# Patient Record
Sex: Male | Born: 1945 | Race: White | Hispanic: No | Smoking: Former smoker
Health system: Southern US, Community
[De-identification: ages and names within clinical notes are randomized; demographics above are authoritative.]

## PROBLEM LIST (undated history)

## (undated) DIAGNOSIS — I259 Chronic ischemic heart disease, unspecified: Secondary | ICD-10-CM

## (undated) DIAGNOSIS — I2510 Atherosclerotic heart disease of native coronary artery without angina pectoris: Secondary | ICD-10-CM

## (undated) DIAGNOSIS — I219 Acute myocardial infarction, unspecified: Secondary | ICD-10-CM

## (undated) DIAGNOSIS — I959 Hypotension, unspecified: Secondary | ICD-10-CM

## (undated) DIAGNOSIS — E785 Hyperlipidemia, unspecified: Secondary | ICD-10-CM

## (undated) DIAGNOSIS — I10 Essential (primary) hypertension: Secondary | ICD-10-CM

## (undated) DIAGNOSIS — J449 Chronic obstructive pulmonary disease, unspecified: Secondary | ICD-10-CM

## (undated) DIAGNOSIS — D72829 Elevated white blood cell count, unspecified: Secondary | ICD-10-CM

## (undated) DIAGNOSIS — R0600 Dyspnea, unspecified: Secondary | ICD-10-CM

## (undated) HISTORY — DX: Hypotension, unspecified: I95.9

## (undated) HISTORY — DX: Dyspnea, unspecified: R06.00

## (undated) HISTORY — DX: Elevated white blood cell count, unspecified: D72.829

## (undated) HISTORY — DX: Acute myocardial infarction, unspecified: I21.9

## (undated) HISTORY — DX: Atherosclerotic heart disease of native coronary artery without angina pectoris: I25.10

## (undated) HISTORY — DX: Chronic ischemic heart disease, unspecified: I25.9

## (undated) HISTORY — DX: Hyperlipidemia, unspecified: E78.5

---

## 1999-12-31 HISTORY — PX: LEFT HEART CATH: SHX5946

## 1999-12-31 HISTORY — PX: PERCUTANEOUS CORONARY STENT INTERVENTION (PCI-S): SHX6016

## 2000-10-12 ENCOUNTER — Inpatient Hospital Stay (HOSPITAL_COMMUNITY): Admission: EM | Admit: 2000-10-12 | Discharge: 2000-10-15 | Payer: Self-pay | Admitting: Emergency Medicine

## 2000-10-12 ENCOUNTER — Encounter: Payer: Self-pay | Admitting: Emergency Medicine

## 2000-10-13 ENCOUNTER — Encounter: Payer: Self-pay | Admitting: Internal Medicine

## 2005-10-21 ENCOUNTER — Ambulatory Visit: Payer: Self-pay | Admitting: Internal Medicine

## 2005-11-29 ENCOUNTER — Ambulatory Visit: Payer: Self-pay | Admitting: Internal Medicine

## 2006-08-22 ENCOUNTER — Ambulatory Visit: Payer: Self-pay | Admitting: Internal Medicine

## 2009-01-10 ENCOUNTER — Emergency Department (HOSPITAL_COMMUNITY): Admission: EM | Admit: 2009-01-10 | Discharge: 2009-01-10 | Payer: Self-pay | Admitting: Emergency Medicine

## 2009-06-20 ENCOUNTER — Telehealth: Payer: Self-pay | Admitting: Internal Medicine

## 2009-07-15 ENCOUNTER — Emergency Department (HOSPITAL_COMMUNITY): Admission: EM | Admit: 2009-07-15 | Discharge: 2009-07-15 | Payer: Self-pay | Admitting: Emergency Medicine

## 2009-07-22 DIAGNOSIS — I10 Essential (primary) hypertension: Secondary | ICD-10-CM

## 2009-07-22 DIAGNOSIS — I219 Acute myocardial infarction, unspecified: Secondary | ICD-10-CM | POA: Insufficient documentation

## 2009-07-22 DIAGNOSIS — D72829 Elevated white blood cell count, unspecified: Secondary | ICD-10-CM | POA: Insufficient documentation

## 2009-07-22 DIAGNOSIS — I2510 Atherosclerotic heart disease of native coronary artery without angina pectoris: Secondary | ICD-10-CM | POA: Insufficient documentation

## 2009-07-22 DIAGNOSIS — F17200 Nicotine dependence, unspecified, uncomplicated: Secondary | ICD-10-CM

## 2009-07-22 DIAGNOSIS — E785 Hyperlipidemia, unspecified: Secondary | ICD-10-CM

## 2009-07-22 DIAGNOSIS — I959 Hypotension, unspecified: Secondary | ICD-10-CM

## 2009-07-22 DIAGNOSIS — I259 Chronic ischemic heart disease, unspecified: Secondary | ICD-10-CM

## 2009-08-30 ENCOUNTER — Ambulatory Visit: Payer: Self-pay | Admitting: Internal Medicine

## 2009-08-30 DIAGNOSIS — R0602 Shortness of breath: Secondary | ICD-10-CM

## 2009-08-30 DIAGNOSIS — J4489 Other specified chronic obstructive pulmonary disease: Secondary | ICD-10-CM | POA: Insufficient documentation

## 2009-08-30 DIAGNOSIS — J449 Chronic obstructive pulmonary disease, unspecified: Secondary | ICD-10-CM

## 2009-09-08 ENCOUNTER — Ambulatory Visit: Payer: Self-pay | Admitting: Internal Medicine

## 2009-09-08 ENCOUNTER — Ambulatory Visit: Payer: Self-pay

## 2009-09-08 ENCOUNTER — Encounter: Payer: Self-pay | Admitting: Internal Medicine

## 2009-09-18 ENCOUNTER — Telehealth: Payer: Self-pay | Admitting: Internal Medicine

## 2009-09-18 LAB — CONVERTED CEMR LAB
ALT: 25 units/L (ref 0–53)
AST: 24 units/L (ref 0–37)
Albumin: 3.9 g/dL (ref 3.5–5.2)
Alkaline Phosphatase: 57 units/L (ref 39–117)
Bilirubin, Direct: 0.1 mg/dL (ref 0.0–0.3)
Cholesterol: 121 mg/dL (ref 0–200)
HDL: 39.7 mg/dL (ref >39.00)
LDL Cholesterol: 71 mg/dL (ref 0–99)
Total Bilirubin: 0.9 mg/dL (ref 0.3–1.2)
Total CHOL/HDL Ratio: 3
Total Protein: 7.1 g/dL (ref 6.0–8.3)
Triglycerides: 54 mg/dL (ref 0.0–149.0)
VLDL: 10.8 mg/dL (ref 0.0–40.0)

## 2011-04-07 LAB — URINALYSIS, ROUTINE W REFLEX MICROSCOPIC
Bilirubin Urine: NEGATIVE
Ketones, ur: NEGATIVE mg/dL
Nitrite: NEGATIVE
Specific Gravity, Urine: 1.007 (ref 1.005–1.030)
Urobilinogen, UA: 0.2 mg/dL (ref 0.0–1.0)
pH: 7 (ref 5.0–8.0)

## 2011-04-11 ENCOUNTER — Other Ambulatory Visit: Payer: Self-pay | Admitting: *Deleted

## 2011-04-11 MED ORDER — BUDESONIDE-FORMOTEROL FUMARATE 160-4.5 MCG/ACT IN AERO: 2.0000 | INHALATION_SPRAY | Freq: Two times a day (BID) | RESPIRATORY_TRACT | Status: DC

## 2011-05-17 NOTE — Assessment & Plan Note (Signed)
[facility]                           ELECTROPHYSIOLOGY OFFICE NOTE   SOFIA, JAQUITH                      MRN:          [id number]  DATE:08/22/2006                            DOB:          [birth year]    Randy Kirk returns today for followup.  He is a very pleasant 65 year old  man with a history of ischemic heart disease status post angioplasty with a  history of tobacco use and hypertension and dyslipidemia.  He returns today  for followup.  He notes that he was unable to keep taking Niaspan that we  had prescribed last October secondary to flushing.  He does admit to be  walking on a regular basis up to 2 miles a day.  He denies chest pain with  exertion and overall has been well.  He denies shortness of breath.  He  denies peripheral edema.   PHYSICAL EXAMINATION:  He is a pleasant middle-aged man in no acute  distress.  The blood pressure today was 110/70, the pulse was 65 and  regular, respirations were 18 and the weight was 164 pounds, down 13 pounds  from his visit back in October.  NECK:  Revealed no jugular venous distension.  LUNGS:  Clear bilaterally to auscultation.  CARDIOVASCULAR:  Exam revealed a regular rate and rhythm with normal S1 and  S2.  EXTREMITIES:  Demonstrated no clubbing, cyanosis or edema.   The EKG today demonstrates sinus rhythm with normal axis and intervals.   IMPRESSION:  1. Ischemic heart disease with preserved left ventricular function.  2. Hypertension.  3. Ongoing tobacco abuse.  4. Dyslipidemia with low HDL.   DISCUSSION:  Overall, Mr. Bethard is stable today.  Will plan to re-prescribe  his nitroglycerin as his bottle of pills has dissolved though he has not  required nitro and I will plan on checking fasting lipids and a liver panel  today.  No medicine changes were made.  He will continue on Toprol, aspirin  and Crestor.  Will see him back in a year.                                   Doylene Canning.  Ladona Ridgel, MD   GWT/MedQ  DD:  08/22/2006  DT:  08/22/2006  Job #:  [id number]   cc:   [facility]

## 2011-05-17 NOTE — Cardiovascular Report (Signed)
[place]. [facility]  Patient:    Randy Kirk, Randy Kirk                      MRN: [id number] Proc. Date: 10/13/00 Adm. Date:  [id number] Attending:  Lewayne Bunting CC:         Vale Haven. Andrey Campanile, M.D., Phone no. [phone]  Doylene Canning. Ladona Ridgel, M.D. [facility]   Cardiac Catheterization  DATE OF BIRTH:  1946/05/30.  REFERRING PHYSICIAN:  Duffy Rhody C. Andrey Campanile, M.D., [facility]  CARDIOLOGIST:  Doylene Canning. Ladona Ridgel, M.D. [facility]  PROCEDURES PERFORMED: 1. Left heart catheterization with selective coronary angiography. 2. Ventriculography. 3. Injection of the right femoral artery, rule out pseudoaneurysm.  DIAGNOSES: 1. High-grade single-vessel coronary artery disease of the mid left anterior    descending artery. 2. Normal left ventricular systolic function. 3. Diastolic dysfunction, elevated left ventricular end diastolic pressure.  HISTORY:  Mr. Gartman is a 65 year old white male with multiple risk factors for coronary artery disease.  The patient had been admitted for new onset substernal chest pain.  Serial enzymes were positive for non-Q-wave myocardial infarction.  The patient has now been referred for a diagnostic catheterization to assess his coronary anatomy.  DESCRIPTION OF PROCEDURE:  After informed consent was obtained, the patient was brought to the catheterization laboratory.  The right groin was sterilely prepped and draped.  Lidocaine, 1%, was used to infiltrate the right groin, and a 6-French arterial sheath was placed using the modified Seldinger technique.  Due to bleeding around the catheter, this sheath was upgraded to a 7-French arterial sheath.  Subsequently, the 6-French JL4 and JR4 catheters were used to engage the left and right coronary arteries.  Selective angiography was performed in various projections using manual injection of contrast.  Subsequently, ventriculography was performed using a 6-French angled pigtail catheter.   Appropriate left-sided hemodynamics were obtained. Ventriculography was then performed in a single-plane RAO projection using power injection of contrast.  After pigtail catheter was removed, angiography was performed through the arterial sheath to rule out pseudoaneurysm.  At the termination of the case, the catheters were removed.  The sheath was left in place and sewn in as per instructions left by Dr. Chales Abrahams.  The patient was given 3000 units of intravenous heparin.  The patient tolerated the procedure well and was then returned to the holding area, awaiting percutaneous coronary intervention of the mid LAD.  FINDINGS:  Hemodynamics:  Left ventricular pressure 116/27 mmHg.  Aortic pressure 116/74 mmHg.  Ventriculography:  Significant ectopy during the contrast injection.  There appeared to be mild hypokinesis on the anterior wall.  Ejection fraction was 65-70% with post PVC beat.  There was no mitral regurgitation.  Selective coronary angiography: 1. Left main coronary artery:  This was a large caliber vessel.  It has focal    stenosis, 30%, in the distal vessel. 2. Left anterior descending artery:  This was a large caliber vessel with a    proximal 20-30% focal stenosis.  Just beyond the first septal perforator,    there was a 90% mid LAD stenosis with apparent thrombus.  The remainder of    the LAD was free of significant flow-limiting coronary artery disease. 3. Left circumflex coronary artery:  This was a large caliber vessel with a    mid 30% stenosis.  The first obtuse marginal branch was a very large    bifurcating branch with a diffuse 20% stenosis in the mid vessel.  The  terminal branches were free of disease. 4. The right coronary artery showed no evidence of flow-limiting coronary    artery disease.  The coronary system appeared to be codominant.  CONCLUSIONS: 1. Single-vessel coronary artery disease with high-grade LAD stenosis of the    mid segment. 2. Normal  LV systolic function. 3. Elevated left ventricular end diastolic pressure.  RECOMMENDATIONS:  Results were discussed with the patient.  Images were reviewed with Dr. Chales Abrahams.  The plan is to proceed with percutaneous coronary intervention to the LAD.  Awaiting the procedure, the arterial sheath was sewn in, and the patient was given 3000 units of intravenous heparin.  I would like a protease 2B3A inhibitor to be continued in the interim. DD:  10/13/00 TD:  10/13/00 Job: [id number]

## 2011-05-17 NOTE — Discharge Summary (Signed)
[place]. [facility]  Patient:    Randy Kirk, Randy Kirk                      MRN: [id number] Adm. Date:  [id number] Disc. Date: [id number] Attending:  Lewayne Bunting Dictator:   Lavella Hammock, P.A. CC:         Dr. Benedetto Goad at [facility]   Referring Physician Discharge [place]  DATE OF BIRTH:  03-Aug-1946  PROCEDURES: 1. Cardiac catheterization. 2. Coronary arteriogram. 3. Left ventriculogram. 4. PTCA and stent of one vessel.  HOSPITAL COURSE:  Randy Kirk is a 65 year old male who went to the emergency room on October 12, 2000 with chest pain.   He described the pain as a tightness that radiated to his back and arms, and was associated with diaphoresis and shortness of breath and nausea, but no vomiting.  He was admitted to rule out MI and for further evaluation.  His enzymes were positive and it was felt that he had had a non-Q-wave MI.  He was stabilized on aspirin, heparin, Integrilin, and scheduled for a cardiac catheterization.  He had a cardiac catheterization on October 13, 2000 which showed a left main with a distal 20-30% lesion and a LAD with a 30% lesion proximally and 90% mid lesion with thrombus.  The circumflex had a mid 30% lesion and there was a large bifurcating OM-1 with a 20% lesion.  The RCA had no significant coronary artery disease.  His EF was 65% with mild anterior hypokinesis.  Percutaneous intervention to the LAD was done with PTCA and two stents to the proximal and mid region.  The stenosis was reduced from 95% to 0 with TIMI 3 flow.  He was placed on Plavix, as well.  He stabilized, and by October 15, 2000, was feeling much better.  He was seen by cardiac rehab and ambulated without any chest pain.  He had some hypotension on Lopressor at 25 mg b.i.d. and this was decreased to Toprol XL 25 mg q.d.  He had leukocytosis, with a white count peaking at 19,000.  However, it was trending down and his urinalysis and  chest x-ray were okay, so it was felt that this was secondary to MI.  He was considered stable for discharge on October 15, 2000.  LABORATORY DATA:  Chest x-ray:  Pulmonary hyperinflation and prominence of the bronchovascular markings are seen, consistent with COPD.  There is no evidence of acute infiltrates or effusion and no masses.  Laboratory values:  Hemoglobin 14.6, hematocrit 40.6, wbcs 16.2, platelets 275.  Sodium 135, potassium 4.4, chloride 104, CO2 23, BUN 10, creatinine 0.9, glucose 124.  CK-MB peaked at 918/78.1, trending down at discharge.  Total cholesterol 199, triglycerides 175, HDL 32, LDL 132.  Urinalysis negative.  DISCHARGE CONDITION:  Improved.  CONSULTS:  None.  COMPLICATIONS:  None.  DISCHARGE DIAGNOSES: 1. Non-Q-wave myocardial infarction with percutaneous transluminal coronary    angioplasty and stent to the left anterior descending artery this    admission. 2. Hyperlipidemia. 3. Leukocytosis with no signs of infection on urinalysis or chest x-ray,    probably secondary to myocardial infarction. 4. Ongoing history of tobacco use. 5. Hypotension secondary beta blocker, dose decreased.  DISCHARGE INSTRUCTIONS:  ACTIVITY:  He is to do no driving for a week and no strenuous or sexual activity until cleared by M.D.  He can return to work per M.D.  DIET:  He is to stick to a  low fat diet.  WOUND CARE:  He is to call the office for bleeding, swelling, or drainage at the catheterization site.  FOLLOW-UP:  He is to follow up with Dr. Ladona Ridgel, and the office will call.  He is to follow up with Dr. Benedetto Goad as needed.  DISCHARGE MEDICATIONS: 1. Coated aspirin 325 mg q.d. 2. Plavix 75 mg q.d. for a month. 3. Lipitor 10 mg q.d. 4. Wellbutrin 150 mg q.d. for three days, then 150 mg b.i.d. 5. Toprol XL 25 mg q.d. 6. Nitroglycerin 0.4 mg sublingual p.r.n. DD:  10/15/00 TD:  10/15/00 Job: [id number]

## 2011-05-17 NOTE — Op Note (Signed)
[place]. [facility]  Patient:    Randy Kirk, Randy Kirk                      MRN: [id number] Proc. Date: 10/13/00 Adm. Date:  [id number] Attending:  Lewayne Bunting CC:         Vale Haven. Andrey Campanile, M.D., [facility]  Lewayne Bunting, M.D.  Doylene Canning. Ladona Ridgel, M.D. [facility]   Operative Report  PROCEDURES PERFORMED: 1. Stenting of the proximal mid left anterior descending artery. 2. Perclose, right femoral artery.  DIAGNOSIS: 1. Severe single-vessel coronary artery disease. 2. Status post non-Q-wave myocardial infarction.  HISTORY:  Mr. Stamps is a 65 year old white male with multiple cardiac risk factors who presents with substernal chest discomfort.  The patient was admitted to [facility] and subsequently ruled in for a non-Q-wave myocardial infarction.  He had significant ventricular ectopy during his hospitalization.  The patient underwent cardiac catheterization by Dr. Andee Lineman showing thrombus and severe narrowing in the mid LAD of greater than 80% with a small residual area of thrombus in the proximal LAD at the takeoff of a trivial diagonal branch.  The remainder of his coronary vessels had noncritical disease.  He had hypokinesis of the anterior wall.  He presents now for percutaneous intervention of the LAD.  DESCRIPTION OF PROCEDURE:  Informed consent had been obtained from the patient.  He was brought to the catheterization lab, and the existing 7-French sheath in the right femoral artery was exchanged over wire for a new sheath. The patient had been on Integrilin and heparin.  This was supplemented to maintain an ACT of approximately 250 seconds.  A 7-French JL4 guide catheter was used to engage the left coronary artery, and selective guide shots were obtained using manual injections of contrast.  This confirmed the presence of two small trivial diagonal branches in the proximal LAD and a large diagonal branch in the mid section.  There was  mild irregular narrowing of 30% at the takeoff of the first diagonal branch with a further high-grade lesion of at least 80% with thrombus in the mid LAD prior to the takeoff of the large distal diagonal branch.  A 0.014-inch Patriot wire was advanced into the distal LAD, and a 3.5 x 18-mm NIR Elite stent positioned in the mid LAD under cineangiography at the area of severe stenosis and just proximal to the large diagonal branch.  This was primarily deployed at 12 atmospheres for 60 seconds.  The stent delivery system was used to further dilate the stent at 16 atmospheres for 30 seconds.  Repeat angiography showed an excellent result with no residual stenosis.  There was the residual lesion in the proximal LAD with evidence of thrombus that appeared slightly worse after stent deployment. A 3.5 x 12-mm NIR Elite stent was then carefully positioned in the proximal LAD and deployed at 18 atmospheres for 30 seconds.  The stent delivery system was then used to hit the junction of the two stents at 18 atmospheres for 30 seconds.  Repeat angiography now showed an excellent result with no residual stenosis, no evidence of residual thrombus, and no distal vessel damage. There was loss of the small first diagonal branch.  Final angiography was performed in various projections showing no distal vessel damage and no residual stenosis.  The guide catheter was then removed, and a Perclose suture closure device was deployed to the right femoral artery until adequate hemostasis was achieved.  The patient tolerated the procedure  well with chest discomfort during balloon inflations.  He was then transferred to the floor in stable condition.  FINAL RESULT:  Successful stenting of the proximal mid LAD with reduction of 80% thrombotic lesion to 0% with placement of a 3.5 x 12-mm NIR Elite stent in the proximal segment followed by a 3.5 x 18-mm NIR Elite stent in the mid segment. DD:  10/13/00 TD:   10/13/00 Job: [id number]

## 2011-08-08 ENCOUNTER — Other Ambulatory Visit: Payer: Self-pay | Admitting: *Deleted

## 2011-08-08 MED ORDER — ROSUVASTATIN CALCIUM 10 MG PO TABS: 10.0000 mg | ORAL_TABLET | Freq: Every day | ORAL | Status: DC

## 2011-08-08 MED ORDER — BUDESONIDE-FORMOTEROL FUMARATE 160-4.5 MCG/ACT IN AERO: 2.0000 | INHALATION_SPRAY | Freq: Two times a day (BID) | RESPIRATORY_TRACT | Status: DC

## 2011-10-10 ENCOUNTER — Other Ambulatory Visit: Payer: Self-pay | Admitting: Internal Medicine

## 2011-11-12 ENCOUNTER — Other Ambulatory Visit: Payer: Self-pay

## 2011-11-12 MED ORDER — METOPROLOL TARTRATE 25 MG PO TABS: 25.0000 mg | ORAL_TABLET | Freq: Every day | ORAL | Status: DC

## 2011-11-12 MED ORDER — METOPROLOL TARTRATE 25 MG PO TABS: 25.0000 mg | ORAL_TABLET | Freq: Two times a day (BID) | ORAL | Status: DC

## 2011-11-12 NOTE — Telephone Encounter (Signed)
.   Requested Prescriptions   Signed Prescriptions Disp Refills  . metoprolol tartrate (LOPRESSOR) 25 MG tablet 30 tablet 2    Sig: Take 1 tablet (25 mg total) by mouth daily.    Authorizing Provider: Lewayne Bunting    Ordering User: Lacie Scotts   Contact pt to set appointment to be seen by Dr. Ladona Ridgel. Pt has not been in for visit currently.E-scribe refill to [facility].  Pt will  come in for check up once appointment is made.

## 2011-12-11 ENCOUNTER — Other Ambulatory Visit: Payer: Self-pay | Admitting: Internal Medicine

## 2012-02-27 ENCOUNTER — Other Ambulatory Visit: Payer: Self-pay | Admitting: Cardiology

## 2012-03-07 ENCOUNTER — Other Ambulatory Visit: Payer: Self-pay | Admitting: Internal Medicine

## 2012-03-18 ENCOUNTER — Telehealth: Payer: Self-pay | Admitting: Internal Medicine

## 2012-03-18 NOTE — Telephone Encounter (Signed)
LOV,12,Echo faxed to [facility] @ [place] [phone] 03/18/12/KM

## 2012-03-21 ENCOUNTER — Other Ambulatory Visit: Payer: Self-pay | Admitting: Cardiology

## 2012-03-24 ENCOUNTER — Telehealth: Payer: Self-pay | Admitting: Internal Medicine

## 2012-03-24 NOTE — Telephone Encounter (Signed)
LOV x2,Echo,12 faxed to [facility] @ [place] @  [phone] 03/24/12/KM

## 2012-04-05 ENCOUNTER — Other Ambulatory Visit: Payer: Self-pay | Admitting: Internal Medicine

## 2012-05-20 ENCOUNTER — Other Ambulatory Visit: Payer: Self-pay | Admitting: Internal Medicine

## 2012-05-20 NOTE — Telephone Encounter (Signed)
Pt needs appointment then refill can be made Fax Received. Refill Completed. Randy Kirk (R.M.A)   

## 2012-05-21 ENCOUNTER — Telehealth: Payer: Self-pay | Admitting: Internal Medicine

## 2012-05-21 NOTE — Telephone Encounter (Signed)
New problem:  Patient calling stating someone called him yesterday returning call back.

## 2012-05-21 NOTE — Telephone Encounter (Signed)
Spoke with the patient.  No one left him a message yesterday.  It was not me that called.  I have asked Efraim Kaufmann is scheduling  It was not her.  I let him know they would call him back hopefully if needed

## 2012-06-26 ENCOUNTER — Other Ambulatory Visit: Payer: Self-pay | Admitting: Internal Medicine

## 2012-07-01 ENCOUNTER — Other Ambulatory Visit: Payer: Self-pay | Admitting: Internal Medicine

## 2012-07-14 ENCOUNTER — Other Ambulatory Visit: Payer: Self-pay | Admitting: Internal Medicine

## 2012-08-10 ENCOUNTER — Ambulatory Visit (INDEPENDENT_AMBULATORY_CARE_PROVIDER_SITE_OTHER): Payer: Medicare Other | Admitting: Internal Medicine

## 2012-08-10 ENCOUNTER — Encounter: Payer: Self-pay | Admitting: Internal Medicine

## 2012-08-10 VITALS — BP 121/71 | HR 67 | Ht 69.0 in | Wt 160.8 lb

## 2012-08-10 DIAGNOSIS — E785 Hyperlipidemia, unspecified: Secondary | ICD-10-CM

## 2012-08-10 DIAGNOSIS — I10 Essential (primary) hypertension: Secondary | ICD-10-CM

## 2012-08-10 DIAGNOSIS — E7800 Pure hypercholesterolemia, unspecified: Secondary | ICD-10-CM

## 2012-08-10 DIAGNOSIS — I2510 Atherosclerotic heart disease of native coronary artery without angina pectoris: Secondary | ICD-10-CM

## 2012-08-10 DIAGNOSIS — F17200 Nicotine dependence, unspecified, uncomplicated: Secondary | ICD-10-CM

## 2012-08-10 MED ORDER — NITROGLYCERIN 0.4 MG SL SUBL: 0.4000 mg | SUBLINGUAL_TABLET | SUBLINGUAL | Status: DC | PRN

## 2012-08-10 MED ORDER — ATORVASTATIN CALCIUM 20 MG PO TABS: 20.0000 mg | ORAL_TABLET | Freq: Every day | ORAL | Status: DC

## 2012-08-10 NOTE — Patient Instructions (Addendum)
Your physician wants you to follow-up in: 12 months with Dr Court Joy will receive a reminder letter in the mail two months in advance. If you don't receive a letter, please call our office to schedule the follow-up appointment.   Your physician has recommended you make the following change in your medication:  1) Stop Crestor 2) Start Atorvastatin 20mg  daily--have your lab work checked fasting around 09/21/12

## 2012-08-10 NOTE — Assessment & Plan Note (Signed)
The patient is having no anginal symptoms at this point. His blood pressure is well controlled. I've encouraged him to increase his physical activity.

## 2012-08-10 NOTE — Assessment & Plan Note (Signed)
The patient states that he has stop smoking for 3 weeks. I've encouraged him to continue smoking cessation.

## 2012-08-10 NOTE — Assessment & Plan Note (Signed)
Today he complains about the high cost of his cholesterol-lowering medication. I'll switch him to atorvastatin.

## 2012-08-10 NOTE — Progress Notes (Signed)
HPI [name] returns today for followup. He is a very pleasant 66 year old man with coronary disease, hypertension, and dyslipidemia. He states that he stopped smoking 3 weeks ago. His wife died just over a year ago from lung cancer. Allergies  Allergen Reactions  . Penicillins      Current Outpatient Prescriptions  Medication Sig Dispense Refill  . metoprolol tartrate (LOPRESSOR) 25 MG tablet Take 25 mg by mouth 2 (two) times daily.      . SYMBICORT 160-4.5 MCG/ACT inhaler INHALE 2 PUFFS INTO THE LUNGS 2 (TWO) TIMES DAILY.  1 Inhaler  3  . DISCONTD: metoprolol tartrate (LOPRESSOR) 25 MG tablet Take 1 tablet (25 mg total) by mouth daily.  30 tablet  2  . atorvastatin (LIPITOR) 20 MG tablet Take 1 tablet (20 mg total) by mouth daily.  90 tablet  3  . nitroGLYCERIN (NITROSTAT) 0.4 MG SL tablet Place 1 tablet (0.4 mg total) under the tongue every 5 (five) minutes as needed for chest pain.  90 tablet  3     No past medical history on file.  ROS:   All systems reviewed and negative except as noted in the HPI.   No past surgical history on file.   No family history on file.   History   Social History  . Marital Status: Married    Spouse Name: N/A    Number of Children: N/A  . Years of Education: N/A   Occupational History  . Not on file.   Social History Main Topics  . Smoking status: Former Smoker -- 20 years    Types: Cigarettes    Quit date: 07/27/2012  . Smokeless tobacco: Never Used  . Alcohol Use: No  . Drug Use: No  . Sexually Active: Not on file   Other Topics Concern  . Not on file   Social History Narrative  . No narrative on file     BP 121/71  Pulse 67  Ht 5\' 9"  (1.753 m)  Wt 160 lb 12.8 oz (72.938 kg)  BMI 23.75 kg/m2  Physical Exam:  Well appearing NAD HEENT: Unremarkable Neck:  No JVD, no thyromegally Lymphatics:  No adenopathy Back:  No CVA tenderness Lungs:  Clear with no wheezes, rales, or rhonchi. HEART:  Regular rate rhythm, no  murmurs, no rubs, no clicks Abd:  soft, positive bowel sounds, no organomegally, no rebound, no guarding Ext:  2 plus pulses, no edema, no cyanosis, no clubbing Skin:  No rashes no nodules Neuro:  CN II through XII intact, motor grossly intact  EKG Normal sinus rhythm with sinus arrhythmia  Assess/Plan:

## 2012-08-22 ENCOUNTER — Other Ambulatory Visit: Payer: Self-pay | Admitting: Cardiology

## 2012-08-24 ENCOUNTER — Other Ambulatory Visit: Payer: Medicare Other

## 2012-09-11 ENCOUNTER — Other Ambulatory Visit: Payer: Self-pay | Admitting: Internal Medicine

## 2012-09-24 ENCOUNTER — Other Ambulatory Visit (INDEPENDENT_AMBULATORY_CARE_PROVIDER_SITE_OTHER): Payer: Medicare Other

## 2012-09-24 DIAGNOSIS — I10 Essential (primary) hypertension: Secondary | ICD-10-CM

## 2012-09-24 DIAGNOSIS — E7800 Pure hypercholesterolemia, unspecified: Secondary | ICD-10-CM

## 2012-09-24 LAB — HEPATIC FUNCTION PANEL
ALT: 15 U/L (ref 0–53)
AST: 19 U/L (ref 0–37)
Alkaline Phosphatase: 59 U/L (ref 39–117)
Total Bilirubin: 0.6 mg/dL (ref 0.3–1.2)

## 2012-09-24 LAB — LIPID PANEL
Total CHOL/HDL Ratio: 3
Triglycerides: 55 mg/dL (ref 0.0–149.0)

## 2013-01-05 ENCOUNTER — Telehealth: Payer: Self-pay | Admitting: Internal Medicine

## 2013-01-05 NOTE — Telephone Encounter (Signed)
New problem:   Patient calling regarding test results from Sept.

## 2013-01-05 NOTE — Telephone Encounter (Signed)
Lab results given from 09/25/11, labs normal/ no changes.

## 2013-02-08 ENCOUNTER — Other Ambulatory Visit: Payer: Self-pay | Admitting: Cardiology

## 2013-04-20 ENCOUNTER — Other Ambulatory Visit: Payer: Self-pay | Admitting: Internal Medicine

## 2013-05-22 ENCOUNTER — Other Ambulatory Visit: Payer: Self-pay | Admitting: Cardiology

## 2013-09-16 ENCOUNTER — Other Ambulatory Visit: Payer: Self-pay | Admitting: Internal Medicine

## 2013-09-17 ENCOUNTER — Other Ambulatory Visit: Payer: Self-pay

## 2013-09-17 MED ORDER — METOPROLOL TARTRATE 25 MG PO TABS: ORAL_TABLET | ORAL | Status: DC

## 2014-02-03 ENCOUNTER — Other Ambulatory Visit: Payer: Self-pay | Admitting: Internal Medicine

## 2014-02-09 ENCOUNTER — Other Ambulatory Visit: Payer: Self-pay

## 2014-02-09 MED ORDER — ATORVASTATIN CALCIUM 20 MG PO TABS: ORAL_TABLET | ORAL | Status: DC

## 2014-02-18 ENCOUNTER — Other Ambulatory Visit: Payer: Self-pay | Admitting: Cardiology

## 2014-03-13 ENCOUNTER — Emergency Department (HOSPITAL_COMMUNITY): Payer: Medicare Other

## 2014-03-13 ENCOUNTER — Encounter (HOSPITAL_COMMUNITY): Payer: Self-pay | Admitting: Emergency Medicine

## 2014-03-13 ENCOUNTER — Emergency Department (HOSPITAL_COMMUNITY)
Admission: EM | Admit: 2014-03-13 | Discharge: 2014-03-13 | Disposition: A | Discharge status: Home / Self Care | Payer: Medicare Other | Attending: Emergency Medicine | Admitting: Emergency Medicine

## 2014-03-13 DIAGNOSIS — R071 Chest pain on breathing: Secondary | ICD-10-CM | POA: Insufficient documentation

## 2014-03-13 DIAGNOSIS — Z880 Allergy status to penicillin: Secondary | ICD-10-CM | POA: Insufficient documentation

## 2014-03-13 DIAGNOSIS — J4489 Other specified chronic obstructive pulmonary disease: Secondary | ICD-10-CM | POA: Insufficient documentation

## 2014-03-13 DIAGNOSIS — Z79899 Other long term (current) drug therapy: Secondary | ICD-10-CM | POA: Insufficient documentation

## 2014-03-13 DIAGNOSIS — I10 Essential (primary) hypertension: Secondary | ICD-10-CM | POA: Insufficient documentation

## 2014-03-13 DIAGNOSIS — Z87891 Personal history of nicotine dependence: Secondary | ICD-10-CM | POA: Insufficient documentation

## 2014-03-13 DIAGNOSIS — E785 Hyperlipidemia, unspecified: Secondary | ICD-10-CM | POA: Insufficient documentation

## 2014-03-13 DIAGNOSIS — Z7982 Long term (current) use of aspirin: Secondary | ICD-10-CM | POA: Insufficient documentation

## 2014-03-13 DIAGNOSIS — J449 Chronic obstructive pulmonary disease, unspecified: Secondary | ICD-10-CM | POA: Insufficient documentation

## 2014-03-13 DIAGNOSIS — R0789 Other chest pain: Secondary | ICD-10-CM

## 2014-03-13 DIAGNOSIS — IMO0002 Reserved for concepts with insufficient information to code with codable children: Secondary | ICD-10-CM | POA: Insufficient documentation

## 2014-03-13 HISTORY — DX: Essential (primary) hypertension: I10

## 2014-03-13 HISTORY — DX: Chronic obstructive pulmonary disease, unspecified: J44.9

## 2014-03-13 HISTORY — DX: Hyperlipidemia, unspecified: E78.5

## 2014-03-13 MED ORDER — MORPHINE SULFATE 4 MG/ML IJ SOLN
4.0000 mg | Freq: Once | INTRAMUSCULAR | Status: AC
  Administered 2014-03-13: 4 mg via INTRAMUSCULAR
  Filled 2014-03-13: qty 1

## 2014-03-13 MED ORDER — NAPROXEN 375 MG PO TABS: 375.0000 mg | ORAL_TABLET | Freq: Two times a day (BID) | ORAL | Status: DC

## 2014-03-13 MED ORDER — ONDANSETRON HCL 4 MG PO TABS
4.0000 mg | ORAL_TABLET | Freq: Once | ORAL | Status: AC
  Administered 2014-03-13: 4 mg via ORAL
  Filled 2014-03-13: qty 1

## 2014-03-13 MED ORDER — OXYCODONE-ACETAMINOPHEN 5-325 MG PO TABS: 1.0000 | ORAL_TABLET | Freq: Four times a day (QID) | ORAL | Status: DC | PRN

## 2014-03-13 NOTE — ED Provider Notes (Signed)
CSN: [id number]     Arrival date & time 03/13/14  1140 History   First MD Initiated Contact with Patient 03/13/14 1230     Chief Complaint  Patient presents with  . Flank Pain     (Consider location/radiation/quality/duration/timing/severity/associated sxs/prior Treatment) HPI Patient reports he started having left lateral posterior chest pain yesterday. He denies any known injury. He states it hurts with deep breathing or coughing. He states if he holds pressure on it it feels better. He has a chronic cough and denies fever. He states he's had pleurisy in the past he cannot recall if this is the thing he states he woke up with the pain without known injury or trauma other than coughing. He states 2 days ago he had pain in his left shoulder that he thought was bursitis. Yesterday had pain in his left flank however it went away when he put heat on it.   [facility] Dr [facility] Dr Ladona Ridgelaylor  Past Medical History  Diagnosis Date  . COPD (chronic obstructive pulmonary disease)   . Hypertension   . Hyperlipidemia   MI years ago   No past surgical history on file. No family history on file. History  Substance Use Topics  . Smoking status: Former Smoker -- 20 years    Types: Cigarettes    Quit date: 07/27/2012  . Smokeless tobacco: Never Used  . Alcohol Use: No  lives at home Lives along  Review of Systems  All other systems reviewed and are negative.      Allergies  Penicillins  Home Medications   Current Outpatient Rx  Name  Route  Sig  Dispense  Refill  . aspirin 325 MG EC tablet   Oral   Take 325 mg by mouth daily.         Marland Kitchen. atorvastatin (LIPITOR) 20 MG tablet   Oral   Take 20 mg by mouth daily.         . budesonide-formoterol (SYMBICORT) 160-4.5 MCG/ACT inhaler   Inhalation   Inhale 2 puffs into the lungs 2 (two) times daily.         Marland Kitchen. HYDROcodone-acetaminophen (NORCO) 10-325 MG per tablet   Oral   Take 0.5-1 tablets by mouth every 6 (six) hours as  needed for severe pain.         . metoprolol tartrate (LOPRESSOR) 25 MG tablet   Oral   Take 25 mg by mouth 2 (two) times daily.         . nitroGLYCERIN (NITROSTAT) 0.4 MG SL tablet   Sublingual   Place 1 tablet (0.4 mg total) under the tongue every 5 (five) minutes as needed for chest pain.   90 tablet   3   . tiZANidine (ZANAFLEX) 2 MG tablet   Oral   Take 2 mg by mouth every 6 (six) hours as needed for muscle spasms.          . naproxen (NAPROSYN) 375 MG tablet   Oral   Take 1 tablet (375 mg total) by mouth 2 (two) times daily.   20 tablet   0    BP 124/69  Pulse 77  Temp(Src) 97.8 F (36.6 C) (Oral)  Resp 16  SpO2 94%  Vital signs normal   Physical Exam  Nursing note and vitals reviewed. Constitutional: He is oriented to person, place, and time. He appears well-developed and well-nourished.  Non-toxic appearance. He does not appear ill. No distress.  HENT:  Head: Normocephalic and atraumatic.  Right  Ear: External ear normal.  Left Ear: External ear normal.  Nose: Nose normal. No mucosal edema or rhinorrhea.  Mouth/Throat: Oropharynx is clear and moist and mucous membranes are normal. No dental abscesses or uvula swelling.  Eyes: Conjunctivae and EOM are normal. Pupils are equal, round, and reactive to light.  Neck: Normal range of motion and full passive range of motion without pain. Neck supple.  Cardiovascular: Normal rate, regular rhythm and normal heart sounds.  Exam reveals no gallop and no friction rub.   No murmur heard. Pulmonary/Chest: Effort normal and breath sounds normal. No respiratory distress. He has no wheezes. He has no rhonchi. He has no rales. He exhibits no tenderness and no crepitus.  Abdominal: Soft. Normal appearance and bowel sounds are normal. He exhibits no distension. There is no tenderness. There is no rebound and no guarding.  Musculoskeletal: Normal range of motion. He exhibits no edema and no tenderness.       Back:  Moves all  extremities well.   Area of pain noted  Neurological: He is alert and oriented to person, place, and time. He has normal strength. No cranial nerve deficit.  Skin: Skin is warm, dry and intact. No rash noted. No erythema. No pallor.  Psychiatric: He has a normal mood and affect. His speech is normal and behavior is normal. His mood appears not anxious.    ED Course  Procedures (including critical care time)  Medications  morphine 4 MG/ML injection 4 mg (4 mg Intramuscular Given 03/13/14 1303)  ondansetron (ZOFRAN) tablet 4 mg (4 mg Oral Given 03/13/14 1303)    Pt give results of his xrays. We discussed she could have a crack in the rib but does not show up on x-rays. His pain is well localized to one small area which appears to be about the ninth rib laterally. His family member thinks he has shingles and wants him to be started on shingles medication. However we discussed we would not start on shingles medications unless he develops a rash. Patient is agreeable to that plan.  Labs Review Labs Reviewed - No data to display Imaging Review Dg Ribs Unilateral W/chest Left  03/13/2014   CLINICAL DATA:  Cough, left lower rib pain  EXAM: LEFT RIBS AND CHEST - 3+ VIEW  COMPARISON:  Prior chest x-ray 07/15/2009  FINDINGS: Cardiac and mediastinal contours are within normal limits. Trace atherosclerotic calcification noted in the transverse aorta. Marked pulmonary hyperexpansion. Central bronchitic changes and diffuse interstitial prominence. Findings are similar compared to prior and likely reflect chronic COPD. No suspicious pulmonary nodule or mass. No evidence of acute fracture or bony lesion by conventional radiography.  IMPRESSION: 1. No acute rib fracture or bony abnormality. 2. Stable chest x-ray without evidence of acute cardiopulmonary process.   Electronically Signed   By: Malachy Moan M.D.   On: 03/13/2014 13:07     EKG Interpretation None      MDM   Final diagnoses:  Left-sided  chest wall pain    Discharge Medication List as of 03/13/2014  2:43 PM    START taking these medications   Details  naproxen (NAPROSYN) 375 MG tablet Take 1 tablet (375 mg total) by mouth 2 (two) times daily., Starting 03/13/2014, Until Discontinued, Print    oxyCODONE-acetaminophen (PERCOCET/ROXICET) 5-325 MG per tablet Take 1 tablet by mouth every 6 (six) hours as needed for severe pain., Starting 03/13/2014, Until Discontinued, Print        Plan discharge  Devoria Albe, MD,  Franz Dell, MD 03/13/14 [phone]

## 2014-03-13 NOTE — ED Notes (Signed)
He c/o pain at lower left lat. Thoracic/ribs area.  He states this began Friday (2 days ago) as pain in his left shoulder.  His skin is normal, warm and dry and he is breathing normally.  He tells me he has COPD.  He denies fever n/v/d, nor any other sign of current illness.

## 2014-03-13 NOTE — ED Notes (Signed)
Pt given a urinal.

## 2014-03-13 NOTE — ED Notes (Addendum)
Pt reports left rib cage pain, sts started yesterday after falling asleep on heating pad which he used for bursitis pain. Pt sts "I had pleurisy before I think this is it again". Pt reports pain is worse when taking deep breath or coughing.

## 2014-03-13 NOTE — Discharge Instructions (Signed)
You could have a subtle crack in your rib from coughing. Take the percocet and naprosyn for pain. Watch for a rash in your back or chest/abdomen. If you get a rash that could be shingles, call your doctor to get on medications. Recheck if you get a fever, struggle to breathe or seem worse.    Chest Wall Pain Chest wall pain is pain in or around the bones and muscles of your chest. It may take up to 6 weeks to get better. It may take longer if you must stay physically active in your work and activities.  CAUSES  Chest wall pain may happen on its own. However, it may be caused by:  A viral illness like the flu.  Injury.  Coughing.  Exercise.  Arthritis.  Fibromyalgia.  Shingles. HOME CARE INSTRUCTIONS   Avoid overtiring physical activity. Try not to strain or perform activities that cause pain. This includes any activities using your chest or your abdominal and side muscles, especially if heavy weights are used.  Put ice on the sore area.  Put ice in a plastic bag.  Place a towel between your skin and the bag.  Leave the ice on for 15-20 minutes per hour while awake for the first 2 days.  Only take over-the-counter or prescription medicines for pain, discomfort, or fever as directed by your caregiver. SEEK IMMEDIATE MEDICAL CARE IF:   Your pain increases, or you are very uncomfortable.  You have a fever.  Your chest pain becomes worse.  You have new, unexplained symptoms.  You have nausea or vomiting.  You feel sweaty or lightheaded.  You have a cough with phlegm (sputum), or you cough up blood. MAKE SURE YOU:   Understand these instructions.  Will watch your condition.  Will get help right away if you are not doing well or get worse. Document Released: 12/16/2005 Document Revised: 03/09/2012 Document Reviewed: 08/12/2011 [facility] Patient Information 2014 [place], [place].

## 2014-03-29 ENCOUNTER — Ambulatory Visit (INDEPENDENT_AMBULATORY_CARE_PROVIDER_SITE_OTHER): Payer: Medicare Other | Admitting: Internal Medicine

## 2014-03-29 ENCOUNTER — Encounter: Payer: Self-pay | Admitting: Internal Medicine

## 2014-03-29 VITALS — BP 143/78 | HR 61 | Ht 69.0 in | Wt 152.0 lb

## 2014-03-29 DIAGNOSIS — E7800 Pure hypercholesterolemia, unspecified: Secondary | ICD-10-CM

## 2014-03-29 DIAGNOSIS — I2510 Atherosclerotic heart disease of native coronary artery without angina pectoris: Secondary | ICD-10-CM

## 2014-03-29 DIAGNOSIS — I259 Chronic ischemic heart disease, unspecified: Secondary | ICD-10-CM

## 2014-03-29 LAB — HEPATIC FUNCTION PANEL
ALT: 21 U/L (ref 0–53)
AST: 17 U/L (ref 0–37)
Albumin: 4 g/dL (ref 3.5–5.2)
Alkaline Phosphatase: 45 U/L (ref 39–117)
BILIRUBIN TOTAL: 1 mg/dL (ref 0.3–1.2)
Bilirubin, Direct: 0.2 mg/dL (ref 0.0–0.3)
Total Protein: 6.8 g/dL (ref 6.0–8.3)

## 2014-03-29 LAB — LIPID PANEL
CHOL/HDL RATIO: 2
Cholesterol: 113 mg/dL (ref 0–200)
HDL: 46.5 mg/dL (ref >39.00)
LDL Cholesterol: 57 mg/dL (ref 0–99)
Triglycerides: 49 mg/dL (ref 0.0–149.0)
VLDL: 9.8 mg/dL (ref 0.0–40.0)

## 2014-03-29 MED ORDER — NAPROXEN 375 MG PO TABS: 375.0000 mg | ORAL_TABLET | Freq: Two times a day (BID) | ORAL | Status: DC

## 2014-03-29 MED ORDER — ALBUTEROL SULFATE HFA 108 (90 BASE) MCG/ACT IN AERS: 2.0000 | INHALATION_SPRAY | Freq: Four times a day (QID) | RESPIRATORY_TRACT | Status: DC | PRN

## 2014-03-29 NOTE — Assessment & Plan Note (Signed)
He will obtain fasting lipids. He remains on Atorvastatin.

## 2014-03-29 NOTE — Patient Instructions (Addendum)
Your physician wants you to follow-up in: 12 months with Dr Court Joyaylor You will receive a reminder letter in the mail two months in advance. If you don't receive a letter, please call our office to schedule the follow-up appointment.    Get Robitussin DM--over the counter

## 2014-03-29 NOTE — Progress Notes (Signed)
HPI Mr. Belvin returns today for followup. He is a very pleasant 68 year old man with coronary disease, hypertension, and dyslipidemia. He states that he stopped smoking but has had some reversions back. He will go for a week without smoking then smoke 3 or 4 cigarettes. He has been bothered by a non-productive cough and developed pleuritic chest pain. No syncope. No fever or hemoptysis.  Allergies  Allergen Reactions  . Penicillins      Current Outpatient Prescriptions  Medication Sig Dispense Refill  . aspirin 325 MG EC tablet Take 325 mg by mouth daily.      Marland Kitchen. atorvastatin (LIPITOR) 20 MG tablet Take 20 mg by mouth daily.      . budesonide-formoterol (SYMBICORT) 160-4.5 MCG/ACT inhaler Inhale 2 puffs into the lungs 2 (two) times daily.      . metoprolol tartrate (LOPRESSOR) 25 MG tablet Take 25 mg by mouth 2 (two) times daily.      . naproxen (NAPROSYN) 375 MG tablet Take 1 tablet (375 mg total) by mouth 2 (two) times daily.  20 tablet  0  . nitroGLYCERIN (NITROSTAT) 0.4 MG SL tablet Place 1 tablet (0.4 mg total) under the tongue every 5 (five) minutes as needed for chest pain.  90 tablet  3  . oxyCODONE-acetaminophen (PERCOCET/ROXICET) 5-325 MG per tablet Take 1 tablet by mouth every 6 (six) hours as needed for severe pain.  15 tablet  0   No current facility-administered medications for this visit.     Past Medical History  Diagnosis Date  . COPD (chronic obstructive pulmonary disease)   . Hypertension   . Hyperlipidemia   . Myocardial infarction   . Leukocytosis   . CAD (coronary artery disease)   . Hypotension   . Dyslipidemia   . Ischemic heart disease   . Dyspnea     ROS:   All systems reviewed and negative except as noted in the HPI.   History reviewed. No pertinent past surgical history.   No family history on file.   History   Social History  . Marital Status: Widowed    Spouse Name: N/A    Number of Children: N/A  . Years of Education: N/A    Occupational History  . Not on file.   Social History Main Topics  . Smoking status: Former Smoker -- 20 years    Types: Cigarettes    Quit date: 07/27/2012  . Smokeless tobacco: Never Used  . Alcohol Use: No  . Drug Use: No  . Sexual Activity: Not on file   Other Topics Concern  . Not on file   Social History Narrative  . No narrative on file     BP 143/78  Pulse 61  Ht 5\' 9"  (1.753 m)  Wt 152 lb (68.947 kg)  BMI 22.44 kg/m2  Physical Exam:  Well appearing 68 yo man, NAD HEENT: Unremarkable Neck:  No JVD, no thyromegally Back:  No CVA tenderness Lungs:  Clear with no wheezes, rales, or rhonchi. HEART:  Regular rate rhythm, no murmurs, no rubs, no clicks Abd:  soft, positive bowel sounds, no organomegally, no rebound, no guarding Ext:  2 plus pulses, no edema, no cyanosis, no clubbing Skin:  No rashes no nodules Neuro:  CN II through XII intact, motor grossly intact  EKG Normal sinus rhythm with sinus arrhythmia  Assess/Plan:

## 2014-03-29 NOTE — Assessment & Plan Note (Signed)
He has pleuritic pain and rib pain from coughing and I have recommended albuterol inhaler and Naproxen for pain.

## 2014-03-29 NOTE — Assessment & Plan Note (Signed)
He denies anginal symptoms. He will continue his current meds.  

## 2014-04-26 ENCOUNTER — Other Ambulatory Visit: Payer: Self-pay

## 2014-04-26 MED ORDER — ATORVASTATIN CALCIUM 20 MG PO TABS: 20.0000 mg | ORAL_TABLET | Freq: Every day | ORAL | Status: DC

## 2014-06-13 ENCOUNTER — Other Ambulatory Visit: Payer: Self-pay

## 2014-06-13 MED ORDER — METOPROLOL TARTRATE 25 MG PO TABS: 25.0000 mg | ORAL_TABLET | Freq: Two times a day (BID) | ORAL | Status: DC

## 2014-08-31 ENCOUNTER — Other Ambulatory Visit: Payer: Self-pay | Admitting: *Deleted

## 2014-08-31 MED ORDER — ATORVASTATIN CALCIUM 20 MG PO TABS: 20.0000 mg | ORAL_TABLET | Freq: Every day | ORAL | Status: DC

## 2015-03-06 ENCOUNTER — Other Ambulatory Visit: Payer: Self-pay | Admitting: Internal Medicine

## 2015-04-12 ENCOUNTER — Encounter: Payer: Self-pay | Admitting: Internal Medicine

## 2015-04-27 ENCOUNTER — Ambulatory Visit: Payer: Self-pay | Admitting: Internal Medicine

## 2015-05-18 ENCOUNTER — Ambulatory Visit (INDEPENDENT_AMBULATORY_CARE_PROVIDER_SITE_OTHER): Payer: Medicare Other | Admitting: Internal Medicine

## 2015-05-18 ENCOUNTER — Encounter: Payer: Self-pay | Admitting: Internal Medicine

## 2015-05-18 VITALS — BP 126/72 | HR 59 | Ht 69.0 in | Wt 149.6 lb

## 2015-05-18 DIAGNOSIS — I10 Essential (primary) hypertension: Secondary | ICD-10-CM | POA: Diagnosis not present

## 2015-05-18 DIAGNOSIS — I25709 Atherosclerosis of coronary artery bypass graft(s), unspecified, with unspecified angina pectoris: Secondary | ICD-10-CM

## 2015-05-18 DIAGNOSIS — E7800 Pure hypercholesterolemia, unspecified: Secondary | ICD-10-CM

## 2015-05-18 DIAGNOSIS — I259 Chronic ischemic heart disease, unspecified: Secondary | ICD-10-CM

## 2015-05-18 MED ORDER — NITROGLYCERIN 0.4 MG SL SUBL: 0.4000 mg | SUBLINGUAL_TABLET | SUBLINGUAL | Status: AC | PRN

## 2015-05-18 MED ORDER — ASPIRIN EC 81 MG PO TBEC: 81.0000 mg | DELAYED_RELEASE_TABLET | Freq: Every day | ORAL | Status: AC

## 2015-05-18 NOTE — Patient Instructions (Signed)
Medication Instructions:  Your physician has recommended you make the following change in your medication:  1) DECREASE Asprin to 81 mg daily  Labwork: None ordered  Testing/Procedures: None ordered  Follow-Up: Your physician wants you to follow-up in: 1 year with Dr. Ladona Ridgelaylor. You will receive a reminder letter in the mail two months in advance. If you don't receive a letter, please call our office to schedule the follow-up appointment.   Thank you for choosing [facility]!!

## 2015-05-18 NOTE — Assessment & Plan Note (Signed)
He denies anginal symptoms. He will continue his current meds. I have asked the patient to reduce his dose of aspirin 81 mg daily.

## 2015-05-18 NOTE — Assessment & Plan Note (Signed)
He will continue his current bronchodilators. He continues to try and stop smoking and has done so mostly. Every week or 2, he will smoke 3 or 4 cigarettes.

## 2015-05-18 NOTE — Assessment & Plan Note (Signed)
His blood pressure is well controlled. He is encouraged to continue his daily activity and maintain a low-sodium diet. No change in medications.

## 2015-05-18 NOTE — Progress Notes (Signed)
HPI Randy Kirk returns today for followup. He is a very pleasant 69 year old man with coronary disease, hypertension, and dyslipidemia. He will go for a week without smoking then smoke 3 or 4 cigarettes. He denies chest pain or sob. He remains active. No syncope. No fever or hemoptysis. He complains of easy bruising Allergies  Allergen Reactions  . Penicillins     swelling     Current Outpatient Prescriptions  Medication Sig Dispense Refill  . albuterol (PROAIR HFA) 108 (90 BASE) MCG/ACT inhaler Inhale 2 puffs into the lungs every 6 (six) hours as needed for wheezing or shortness of breath. 1 Inhaler 0  . atorvastatin (LIPITOR) 20 MG tablet TAKE 1 TABLET BY MOUTH EVERY DAY 90 tablet 0  . budesonide-formoterol (SYMBICORT) 160-4.5 MCG/ACT inhaler Inhale 2 puffs into the lungs 2 (two) times daily.    . metoprolol tartrate (LOPRESSOR) 25 MG tablet Take 1 tablet (25 mg total) by mouth 2 (two) times daily. 180 tablet 3  . nitroGLYCERIN (NITROSTAT) 0.4 MG SL tablet Place 1 tablet (0.4 mg total) under the tongue every 5 (five) minutes as needed for chest pain. 90 tablet 3   No current facility-administered medications for this visit.     Past Medical History  Diagnosis Date  . COPD (chronic obstructive pulmonary disease)   . Hypertension   . Hyperlipidemia   . Myocardial infarction   . Leukocytosis   . CAD (coronary artery disease)   . Hypotension   . Dyslipidemia   . Ischemic heart disease   . Dyspnea     ROS:   All systems reviewed and negative except as noted in the HPI.   History reviewed. No pertinent past surgical history.   Family History  Problem Relation Age of Onset  . Family history unknown: Yes     History   Social History  . Marital Status: Widowed    Spouse Name: N/A  . Number of Children: N/A  . Years of Education: N/A   Occupational History  . Not on file.   Social History Main Topics  . Smoking status: Former Smoker -- 20 years    Types:  Cigarettes    Quit date: 07/27/2012  . Smokeless tobacco: Never Used  . Alcohol Use: No  . Drug Use: No  . Sexual Activity: Not on file   Other Topics Concern  . Not on file   Social History Narrative     BP 126/72 mmHg  Pulse 59  Ht 5\' 9"  (1.753 m)  Wt 149 lb 9.6 oz (67.858 kg)  BMI 22.08 kg/m2  Physical Exam:  Well appearing 69 yo man, NAD HEENT: Unremarkable Neck:  No JVD, no thyromegally Back:  No CVA tenderness Lungs:  Clear with no wheezes, rales, or rhonchi. HEART:  Regular rate rhythm, no murmurs, no rubs, no clicks Abd:  soft, positive bowel sounds, no organomegally, no rebound, no guarding Ext:  2 plus pulses, no edema, no cyanosis, no clubbing Skin:  No rashes no nodules Neuro:  CN II through XII intact, motor grossly intact  EKG Normal sinus rhythm with sinus arrhythmia  Assess/Plan:

## 2015-06-03 ENCOUNTER — Other Ambulatory Visit: Payer: Self-pay | Admitting: Internal Medicine

## 2015-09-06 ENCOUNTER — Other Ambulatory Visit: Payer: Self-pay | Admitting: Internal Medicine

## 2015-12-02 ENCOUNTER — Other Ambulatory Visit: Payer: Self-pay | Admitting: Internal Medicine

## 2016-06-02 ENCOUNTER — Other Ambulatory Visit: Payer: Self-pay | Admitting: Internal Medicine

## 2016-06-20 ENCOUNTER — Encounter: Payer: Self-pay | Admitting: *Deleted

## 2016-07-04 ENCOUNTER — Ambulatory Visit: Payer: Medicare Other | Admitting: Internal Medicine

## 2016-08-29 ENCOUNTER — Ambulatory Visit (INDEPENDENT_AMBULATORY_CARE_PROVIDER_SITE_OTHER): Payer: Medicare Other | Admitting: Internal Medicine

## 2016-08-29 ENCOUNTER — Encounter: Payer: Self-pay | Admitting: Internal Medicine

## 2016-08-29 VITALS — BP 118/80 | HR 64 | Ht 69.0 in | Wt 129.8 lb

## 2016-08-29 DIAGNOSIS — I25709 Atherosclerosis of coronary artery bypass graft(s), unspecified, with unspecified angina pectoris: Secondary | ICD-10-CM

## 2016-08-29 MED ORDER — ATORVASTATIN CALCIUM 20 MG PO TABS
20.0000 mg | ORAL_TABLET | Freq: Every day | ORAL | 3 refills | Status: AC
Start: 2016-08-29 — End: ?

## 2016-08-29 MED ORDER — METOPROLOL TARTRATE 25 MG PO TABS: ORAL_TABLET | ORAL | 2 refills | Status: DC

## 2016-08-29 NOTE — Progress Notes (Signed)
HPI [name] returns today for followup. He is a very pleasant 70 year old man with coronary disease, hypertension, and dyslipidemia. He has stopped smoking.  He denies chest pain or sob. He remains active. No syncope. No fever or hemoptysis. He complains of easy bruising Allergies  Allergen Reactions  . Penicillins     swelling     Current Outpatient Prescriptions  Medication Sig Dispense Refill  . albuterol (PROAIR HFA) 108 (90 BASE) MCG/ACT inhaler Inhale 2 puffs into the lungs every 6 (six) hours as needed for wheezing or shortness of breath. 1 Inhaler 0  . aspirin EC 81 MG tablet Take 1 tablet (81 mg total) by mouth daily. 90 tablet 3  . atorvastatin (LIPITOR) 20 MG tablet TAKE 1 TABLET BY MOUTH EVERY DAY 90 tablet 0  . budesonide-formoterol (SYMBICORT) 160-4.5 MCG/ACT inhaler Inhale 2 puffs into the lungs 2 (two) times daily.    . metoprolol tartrate (LOPRESSOR) 25 MG tablet TAKE 1 TABLET (25 MG TOTAL) BY MOUTH 2 (TWO) TIMES DAILY. 180 tablet 2  . nitroGLYCERIN (NITROSTAT) 0.4 MG SL tablet Place 1 tablet (0.4 mg total) under the tongue every 5 (five) minutes as needed for chest pain. 25 tablet 3   No current facility-administered medications for this visit.      Past Medical History:  Diagnosis Date  . CAD (coronary artery disease)   . COPD (chronic obstructive pulmonary disease) (HCC)   . Dyslipidemia   . Dyspnea   . Hyperlipidemia   . Hypertension   . Hypotension   . Ischemic heart disease   . Leukocytosis   . Myocardial infarction (HCC)     ROS:   All systems reviewed and negative except as noted in the HPI.   Past Surgical History:  Procedure Laterality Date  . LEFT HEART CATH  2001   with selective coronary angiography  . PERCUTANEOUS CORONARY STENT INTERVENTION (PCI-S)  2001   of the mid LAD     Family History  Problem Relation Age of Onset  . Family history unknown: Yes     Social History   Social History  . Marital status: Widowed    Spouse  name: N/A  . Number of children: N/A  . Years of education: N/A   Occupational History  . Not on file.   Social History Main Topics  . Smoking status: Former Smoker    Years: 20.00    Types: Cigarettes    Quit date: 07/27/2012  . Smokeless tobacco: Never Used  . Alcohol use No  . Drug use: No  . Sexual activity: Not on file   Other Topics Concern  . Not on file   Social History Narrative  . No narrative on file     BP 118/80   Pulse 64   Ht 5\' 9"  (1.753 m)   Wt 129 lb 12.8 oz (58.9 kg)   BMI 19.17 kg/m   Physical Exam:  Well appearing 70 yo man, NAD HEENT: Unremarkable Neck:  6 cm JVD, no thyromegally Back:  No CVA tenderness Lungs:  Clear with no wheezes, rales, or rhonchi. HEART:  Regular rate rhythm, no murmurs, no rubs, no clicks Abd:  soft, positive bowel sounds, no organomegally, no rebound, no guarding Ext:  2 plus pulses, no edema, no cyanosis, no clubbing Skin:  No rashes no nodules Neuro:  CN II through XII intact, motor grossly intact  EKG Normal sinus rhythm with AS MI  Assess/Plan:  1. CAD, s/p remote MI - he has  no anginal symptoms. He will continue his current medications. 2. HTN - his blood pressure is good. We discussed switching his beta blocker but he has decided to continue metoprolol. 3. Dyslipidemia - he will continue lipitor.  Leonia Reeves.D.

## 2016-08-29 NOTE — Patient Instructions (Signed)

## 2016-09-01 ENCOUNTER — Other Ambulatory Visit: Payer: Self-pay | Admitting: Internal Medicine

## 2016-09-03 NOTE — Telephone Encounter (Signed)
Pt called in to have Proair Albuterol Inhaler. Per Dr. Ladona Ridgelaylor and Dennis BastKelly Lanier RN, I am only able to fill one time and following refills will have to come from PCP. Pt is agreeable and understand.

## 2016-12-05 ENCOUNTER — Encounter (HOSPITAL_COMMUNITY): Payer: Self-pay | Admitting: Emergency Medicine

## 2016-12-05 ENCOUNTER — Emergency Department (HOSPITAL_COMMUNITY): Payer: Medicare Other

## 2016-12-05 ENCOUNTER — Inpatient Hospital Stay (HOSPITAL_COMMUNITY)
Admission: EM | Admit: 2016-12-05 | Discharge: 2016-12-09 | DRG: 190 | Disposition: A | Discharge status: Home / Self Care | Payer: Medicare Other | Attending: Internal Medicine | Admitting: Internal Medicine

## 2016-12-05 DIAGNOSIS — E785 Hyperlipidemia, unspecified: Secondary | ICD-10-CM | POA: Diagnosis present

## 2016-12-05 DIAGNOSIS — Z87891 Personal history of nicotine dependence: Secondary | ICD-10-CM

## 2016-12-05 DIAGNOSIS — I2510 Atherosclerotic heart disease of native coronary artery without angina pectoris: Secondary | ICD-10-CM | POA: Diagnosis present

## 2016-12-05 DIAGNOSIS — Z7951 Long term (current) use of inhaled steroids: Secondary | ICD-10-CM

## 2016-12-05 DIAGNOSIS — I252 Old myocardial infarction: Secondary | ICD-10-CM

## 2016-12-05 DIAGNOSIS — R0602 Shortness of breath: Secondary | ICD-10-CM | POA: Diagnosis not present

## 2016-12-05 DIAGNOSIS — I259 Chronic ischemic heart disease, unspecified: Secondary | ICD-10-CM | POA: Diagnosis present

## 2016-12-05 DIAGNOSIS — Z66 Do not resuscitate: Secondary | ICD-10-CM | POA: Diagnosis present

## 2016-12-05 DIAGNOSIS — J441 Chronic obstructive pulmonary disease with (acute) exacerbation: Principal | ICD-10-CM

## 2016-12-05 DIAGNOSIS — E860 Dehydration: Secondary | ICD-10-CM | POA: Diagnosis present

## 2016-12-05 DIAGNOSIS — Z7982 Long term (current) use of aspirin: Secondary | ICD-10-CM

## 2016-12-05 DIAGNOSIS — J9601 Acute respiratory failure with hypoxia: Secondary | ICD-10-CM | POA: Diagnosis present

## 2016-12-05 DIAGNOSIS — Z955 Presence of coronary angioplasty implant and graft: Secondary | ICD-10-CM

## 2016-12-05 DIAGNOSIS — Z880 Allergy status to penicillin: Secondary | ICD-10-CM

## 2016-12-05 DIAGNOSIS — I10 Essential (primary) hypertension: Secondary | ICD-10-CM | POA: Diagnosis present

## 2016-12-05 DIAGNOSIS — R634 Abnormal weight loss: Secondary | ICD-10-CM | POA: Diagnosis present

## 2016-12-05 DIAGNOSIS — Z87898 Personal history of other specified conditions: Secondary | ICD-10-CM | POA: Diagnosis present

## 2016-12-05 DIAGNOSIS — Z79899 Other long term (current) drug therapy: Secondary | ICD-10-CM

## 2016-12-05 LAB — CBC WITH DIFFERENTIAL/PLATELET
BASOS PCT: 1 %
Basophils Absolute: 0.1 10*3/uL (ref 0.0–0.1)
EOS ABS: 0.2 10*3/uL (ref 0.0–0.7)
Eosinophils Relative: 1 %
HCT: 45.3 % (ref 39.0–52.0)
Hemoglobin: 16.5 g/dL (ref 13.0–17.0)
Lymphocytes Relative: 18 %
Lymphs Abs: 2.1 10*3/uL (ref 0.7–4.0)
MCH: 33.5 pg (ref 26.0–34.0)
MCHC: 36.4 g/dL — ABNORMAL HIGH (ref 30.0–36.0)
MCV: 92.1 fL (ref 78.0–100.0)
Monocytes Absolute: 1 10*3/uL (ref 0.1–1.0)
Monocytes Relative: 8 %
NEUTROS PCT: 72 %
Neutro Abs: 8.8 10*3/uL — ABNORMAL HIGH (ref 1.7–7.7)
Platelets: 259 10*3/uL (ref 150–400)
RBC: 4.92 MIL/uL (ref 4.22–5.81)
RDW: 12.9 % (ref 11.5–15.5)
WBC: 12.2 10*3/uL — AB (ref 4.0–10.5)

## 2016-12-05 LAB — BASIC METABOLIC PANEL
ANION GAP: 10 (ref 5–15)
BUN: 16 mg/dL (ref 6–20)
CO2: 21 mmol/L — ABNORMAL LOW (ref 22–32)
Calcium: 9.2 mg/dL (ref 8.9–10.3)
Chloride: 105 mmol/L (ref 101–111)
Creatinine, Ser: 0.84 mg/dL (ref 0.61–1.24)
GFR calc Af Amer: >60 mL/min (ref >60)
GLUCOSE: 96 mg/dL (ref 65–99)
POTASSIUM: 3.7 mmol/L (ref 3.5–5.1)
SODIUM: 136 mmol/L (ref 135–145)

## 2016-12-05 LAB — CBC
HCT: 41.7 % (ref 39.0–52.0)
HEMOGLOBIN: 14.7 g/dL (ref 13.0–17.0)
MCH: 32.4 pg (ref 26.0–34.0)
MCHC: 35.3 g/dL (ref 30.0–36.0)
MCV: 91.9 fL (ref 78.0–100.0)
Platelets: 252 10*3/uL (ref 150–400)
RBC: 4.54 MIL/uL (ref 4.22–5.81)
RDW: 12.8 % (ref 11.5–15.5)
WBC: 10.5 10*3/uL (ref 4.0–10.5)

## 2016-12-05 LAB — INFLUENZA PANEL BY PCR (TYPE A & B)
INFLAPCR: NEGATIVE
Influenza B By PCR: NEGATIVE

## 2016-12-05 LAB — CREATININE, SERUM
Creatinine, Ser: 0.92 mg/dL (ref 0.61–1.24)
GFR calc non Af Amer: >60 mL/min (ref >60)

## 2016-12-05 LAB — BRAIN NATRIURETIC PEPTIDE: B NATRIURETIC PEPTIDE 5: 29.4 pg/mL (ref 0.0–100.0)

## 2016-12-05 LAB — TROPONIN I

## 2016-12-05 MED ORDER — ALBUTEROL SULFATE (2.5 MG/3ML) 0.083% IN NEBU
5.0000 mg | INHALATION_SOLUTION | Freq: Once | RESPIRATORY_TRACT | Status: AC
  Administered 2016-12-05: 5 mg via RESPIRATORY_TRACT
  Filled 2016-12-05: qty 6

## 2016-12-05 MED ORDER — ONDANSETRON HCL 4 MG PO TABS: 4.0000 mg | ORAL_TABLET | Freq: Four times a day (QID) | ORAL | Status: DC | PRN

## 2016-12-05 MED ORDER — IBUPROFEN 200 MG PO TABS: 400.0000 mg | ORAL_TABLET | Freq: Four times a day (QID) | ORAL | Status: DC | PRN

## 2016-12-05 MED ORDER — IPRATROPIUM-ALBUTEROL 0.5-2.5 (3) MG/3ML IN SOLN
3.0000 mL | Freq: Once | RESPIRATORY_TRACT | Status: AC
  Administered 2016-12-05: 3 mL via RESPIRATORY_TRACT
  Filled 2016-12-05: qty 3

## 2016-12-05 MED ORDER — GUAIFENESIN ER 600 MG PO TB12
600.0000 mg | ORAL_TABLET | Freq: Two times a day (BID) | ORAL | Status: DC
  Administered 2016-12-05 – 2016-12-09 (×8): 600 mg via ORAL
  Filled 2016-12-05 (×8): qty 1

## 2016-12-05 MED ORDER — SODIUM CHLORIDE 0.9 % IV BOLUS (SEPSIS)
1000.0000 mL | Freq: Once | INTRAVENOUS | Status: AC
  Administered 2016-12-05: 1000 mL via INTRAVENOUS

## 2016-12-05 MED ORDER — ALBUTEROL SULFATE (2.5 MG/3ML) 0.083% IN NEBU
2.5000 mg | INHALATION_SOLUTION | RESPIRATORY_TRACT | Status: DC | PRN
  Administered 2016-12-05: 2.5 mg via RESPIRATORY_TRACT
  Filled 2016-12-05: qty 3

## 2016-12-05 MED ORDER — GUAIFENESIN-DM 100-10 MG/5ML PO SYRP
5.0000 mL | ORAL_SOLUTION | ORAL | Status: DC | PRN
  Administered 2016-12-05: 5 mL via ORAL
  Filled 2016-12-05: qty 5

## 2016-12-05 MED ORDER — TRAZODONE HCL 50 MG PO TABS
25.0000 mg | ORAL_TABLET | Freq: Every evening | ORAL | Status: DC | PRN
  Administered 2016-12-05: 25 mg via ORAL
  Filled 2016-12-05 (×2): qty 1

## 2016-12-05 MED ORDER — HYDRALAZINE HCL 20 MG/ML IJ SOLN: 10.0000 mg | Freq: Three times a day (TID) | INTRAMUSCULAR | Status: DC | PRN

## 2016-12-05 MED ORDER — NICOTINE 7 MG/24HR TD PT24
7.0000 mg | MEDICATED_PATCH | Freq: Every day | TRANSDERMAL | Status: DC
  Administered 2016-12-05 – 2016-12-09 (×5): 7 mg via TRANSDERMAL
  Filled 2016-12-05 (×5): qty 1

## 2016-12-05 MED ORDER — ENOXAPARIN SODIUM 40 MG/0.4ML ~~LOC~~ SOLN
40.0000 mg | SUBCUTANEOUS | Status: DC
  Administered 2016-12-05 – 2016-12-08 (×4): 40 mg via SUBCUTANEOUS
  Filled 2016-12-05 (×4): qty 0.4

## 2016-12-05 MED ORDER — ONDANSETRON HCL 4 MG/2ML IJ SOLN: 4.0000 mg | Freq: Four times a day (QID) | INTRAMUSCULAR | Status: DC | PRN

## 2016-12-05 MED ORDER — ALBUTEROL (5 MG/ML) CONTINUOUS INHALATION SOLN
10.0000 mg/h | INHALATION_SOLUTION | Freq: Once | RESPIRATORY_TRACT | Status: AC
  Administered 2016-12-05: 10 mg/h via RESPIRATORY_TRACT
  Filled 2016-12-05: qty 20

## 2016-12-05 MED ORDER — ASPIRIN EC 81 MG PO TBEC
81.0000 mg | DELAYED_RELEASE_TABLET | Freq: Every day | ORAL | Status: DC
  Administered 2016-12-05 – 2016-12-09 (×5): 81 mg via ORAL
  Filled 2016-12-05 (×5): qty 1

## 2016-12-05 MED ORDER — PREDNISONE 20 MG PO TABS
40.0000 mg | ORAL_TABLET | Freq: Every day | ORAL | Status: DC
  Administered 2016-12-05 – 2016-12-07 (×3): 40 mg via ORAL
  Filled 2016-12-05 (×3): qty 2

## 2016-12-05 MED ORDER — POLYETHYLENE GLYCOL 3350 17 G PO PACK: 17.0000 g | PACK | Freq: Every day | ORAL | Status: DC | PRN

## 2016-12-05 MED ORDER — IPRATROPIUM-ALBUTEROL 0.5-2.5 (3) MG/3ML IN SOLN
3.0000 mL | Freq: Four times a day (QID) | RESPIRATORY_TRACT | Status: DC
  Administered 2016-12-05 – 2016-12-06 (×7): 3 mL via RESPIRATORY_TRACT
  Filled 2016-12-05 (×8): qty 3

## 2016-12-05 MED ORDER — ACETAMINOPHEN 325 MG PO TABS: 650.0000 mg | ORAL_TABLET | Freq: Four times a day (QID) | ORAL | Status: DC | PRN

## 2016-12-05 MED ORDER — BUDESONIDE 0.5 MG/2ML IN SUSP
0.5000 mg | Freq: Two times a day (BID) | RESPIRATORY_TRACT | Status: DC
  Administered 2016-12-05 – 2016-12-09 (×9): 0.5 mg via RESPIRATORY_TRACT
  Filled 2016-12-05 (×9): qty 2

## 2016-12-05 MED ORDER — DOXYCYCLINE HYCLATE 100 MG PO TABS
100.0000 mg | ORAL_TABLET | Freq: Two times a day (BID) | ORAL | Status: DC
  Administered 2016-12-05 – 2016-12-09 (×9): 100 mg via ORAL
  Filled 2016-12-05 (×9): qty 1

## 2016-12-05 MED ORDER — METOPROLOL TARTRATE 25 MG PO TABS
25.0000 mg | ORAL_TABLET | Freq: Two times a day (BID) | ORAL | Status: DC
  Administered 2016-12-05 – 2016-12-09 (×9): 25 mg via ORAL
  Filled 2016-12-05 (×9): qty 1

## 2016-12-05 MED ORDER — ACETAMINOPHEN 650 MG RE SUPP: 650.0000 mg | Freq: Four times a day (QID) | RECTAL | Status: DC | PRN

## 2016-12-05 MED ORDER — ATORVASTATIN CALCIUM 20 MG PO TABS
20.0000 mg | ORAL_TABLET | Freq: Every day | ORAL | Status: DC
  Administered 2016-12-05 – 2016-12-09 (×5): 20 mg via ORAL
  Filled 2016-12-05: qty 1
  Filled 2016-12-05: qty 2
  Filled 2016-12-05 (×3): qty 1

## 2016-12-05 MED ORDER — NITROGLYCERIN 0.4 MG SL SUBL: 0.4000 mg | SUBLINGUAL_TABLET | SUBLINGUAL | Status: DC | PRN

## 2016-12-05 NOTE — ED Provider Notes (Addendum)
[facility]-EMERGENCY DEPT Provider Note   CSN: [id number] Arrival date & time: 12/05/16  0320  By signing my name below, I, [name], attest that this documentation has been prepared under the direction and in the presence of Randy Boozeavid Janeil Schexnayder, MD. Electronically Signed: Angelene GiovanniEmmanuella Mensah, ED Scribe. 12/05/16. 3:40 AM.   History   Chief Complaint Chief Complaint  Patient presents with  . Shortness of Breath   HPI Comments: Randy Kirk is a 70 y.o. male with a hx of CAD, COPD, hypertension, and past MI brought in by ambulance, who presents to the Emergency Department complaining of gradually worsening persistent moderate shortness of breath onset one week ago. He reports associated chills and non-productive cough, stating that he is unable to produce any sputum no matter how hard he coughs. No alleviating factors noted. Pt has not tried any medications PTA. He presents with bilateral leg swelling but attributes that to standing up on his feet more than normal under the fan these past few days to get a "good breath in". No alleviating factors noted. Pt did not try any medications prior to EMS arrival. He states that he receive 2 breathing treatments and prednisone en route by EMS with minimal relief. Pt has an allergy to Penicillins. He reports that he smokes approximately 8-10 cigarettes daily. He denies any fever, chest pain, chest tightness, nausea, vomiting, abdominal pain, or any other symptoms.   The history is provided by the patient. No language interpreter was used.  Shortness of Breath  This is a new problem. The average episode lasts 1 week. The problem occurs intermittently.The current episode started more than 2 days ago. The problem has been gradually worsening. Associated symptoms include cough, wheezing and leg swelling. Pertinent negatives include no fever, no sputum production, no chest pain, no vomiting, no abdominal pain and no rash. Risk factors include smoking. He has tried  nothing for the symptoms. The treatment provided no relief. Associated medical issues include COPD, CAD and past MI.    Past Medical History:  Diagnosis Date  . CAD (coronary artery disease)   . COPD (chronic obstructive pulmonary disease) (HCC)   . Dyslipidemia   . Dyspnea   . Hyperlipidemia   . Hypertension   . Hypotension   . Ischemic heart disease   . Leukocytosis   . Myocardial infarction     Patient Active Problem List   Diagnosis Date Noted  . Hyperlipidemia   . COPD 08/30/2009  . DYSPNEA 08/30/2009  . DYSLIPIDEMIA 07/22/2009  . LEUKOCYTOSIS 07/22/2009  . TOBACCO ABUSE 07/22/2009  . Essential hypertension 07/22/2009  . MYOCARDIAL INFARCTION 07/22/2009  . CAD 07/22/2009  . Chronic ischemic heart disease 07/22/2009  . HYPOTENSION 07/22/2009    Past Surgical History:  Procedure Laterality Date  . LEFT HEART CATH  2001   with selective coronary angiography  . PERCUTANEOUS CORONARY STENT INTERVENTION (PCI-S)  2001   of the mid LAD       Home Medications    Prior to Admission medications   Medication Sig Start Date End Date Taking? Authorizing Provider  aspirin EC 81 MG tablet Take 1 tablet (81 mg total) by mouth daily. 05/18/15   Marinus MawGregg W Taylor, MD  atorvastatin (LIPITOR) 20 MG tablet Take 1 tablet (20 mg total) by mouth daily. 08/29/16   Marinus MawGregg W Taylor, MD  atorvastatin (LIPITOR) 20 MG tablet TAKE 1 TABLET BY MOUTH EVERY DAY 09/03/16   Marinus MawGregg W Taylor, MD  budesonide-formoterol [facility](SYMBICORT) 160-4.5 MCG/ACT inhaler Inhale 2 puffs  into the lungs 2 (two) times daily.    Historical Provider, MD  metoprolol tartrate (LOPRESSOR) 25 MG tablet TAKE 1 TABLET (25 MG TOTAL) BY MOUTH 2 (TWO) TIMES DAILY. 08/29/16   [name], MD  metoprolol tartrate (LOPRESSOR) 25 MG tablet TAKE 1 TABLET (25 MG TOTAL) BY MOUTH 2 (TWO) TIMES DAILY. 09/03/16   [name], MD  nitroGLYCERIN (NITROSTAT) 0.4 MG SL tablet Place 1 tablet (0.4 mg total) under the tongue every 5 (five) minutes as  needed for chest pain. 05/18/15 01/03/17  [name], MD  PROAIR HFA [phone] Base) MCG/ACT inhaler INHALE 1 TO 2 PUFFS EVERY 4 TO 6 HOURS AS NEEDED. 09/03/16   [name], MD    Family History Family History  Problem Relation Age of Onset  . Family history unknown: Yes    Social History Social History  Substance Use Topics  . Smoking status: Former Smoker    Years: 20.00    Types: Cigarettes    Quit date: 07/27/2012  . Smokeless tobacco: Never Used  . Alcohol use No     Allergies   Penicillins   Review of Systems Review of Systems  Constitutional: Positive for chills. Negative for fever.  Respiratory: Positive for cough, shortness of breath and wheezing. Negative for sputum production.   Cardiovascular: Positive for leg swelling. Negative for chest pain.  Gastrointestinal: Negative for abdominal pain, nausea and vomiting.  Skin: Negative for rash.     Physical Exam Updated Vital Signs BP 135/90   Pulse 112   Temp 97.9 F (36.6 C) (Oral)   Resp 21   SpO2 95%   Physical Exam  Constitutional: He is oriented to person, place, and time. He appears well-developed and well-nourished.  Appears dyspneic   HENT:  Head: Normocephalic and atraumatic.  Eyes: EOM are normal. Pupils are equal, round, and reactive to light.  Neck: Normal range of motion. Neck supple. JVD present.  JVD present at 90 degrees   Cardiovascular: Normal rate, regular rhythm and normal heart sounds.   No murmur heard. Pulmonary/Chest: He has wheezes. He has no rales. He exhibits no tenderness.  Decreased airflow diffusely; mild expiratory wheezing  Abdominal: Soft. Bowel sounds are normal. He exhibits no distension and no mass. There is no tenderness.  Musculoskeletal: Normal range of motion. He exhibits no edema.  Lymphadenopathy:    He has no cervical adenopathy.  Neurological: He is alert and oriented to person, place, and time. No cranial nerve deficit. He exhibits normal muscle tone.  Coordination normal.  1+ pretibial edema   Skin: Skin is warm and dry. No rash noted.  Psychiatric: He has a normal mood and affect. His behavior is normal. Judgment and thought content normal.  Nursing note and vitals reviewed.    ED Treatments / Results  DIAGNOSTIC STUDIES: Oxygen Saturation is 98% on RA, normal by my interpretation.    COORDINATION OF CARE: 3:35 AM- Pt advised of plan for treatment and pt agrees. Pt will receive lab work, EKG, and chest x-ray for further evaluation. He will also receive another albuterol nebulizer treatment and duo neb.    Labs (all labs ordered are listed, but only abnormal results are displayed) Labs Reviewed  BASIC METABOLIC PANEL - Abnormal; Notable for the following:       Result Value   CO2 21 (*)    All other components within normal limits  CBC WITH DIFFERENTIAL/PLATELET - Abnormal; Notable for the following:    WBC  12.2 (*)    MCHC 36.4 (*)    Neutro Abs 8.8 (*)    All other components within normal limits  TROPONIN I  BRAIN NATRIURETIC PEPTIDE    EKG  EKG Interpretation  Date/Time:  Thursday December 05 2016 03:23:34 EST Ventricular Rate:  112 PR Interval:  168 QRS Duration: 76 QT Interval:  334 QTC Calculation: 455 R Axis:   70 Text Interpretation:  Sinus tachycardia with Premature supraventricular complexes Septal infarct , age undetermined Abnormal ECG When compared with ECG of 10/13/2000, T wave inversion is no longer Present HEART RATE has increased Confirmed by Preston FleetingGLICK  MD, Rai Severns ([id number]) on 12/05/2016 3:29:14 AM       Radiology Dg Chest Port 1 View  Result Date: 12/05/2016 CLINICAL DATA:  Dyspnea tonight EXAM: PORTABLE CHEST 1 VIEW COMPARISON:  03/13/2014 FINDINGS: Marked unchanged hyperinflation. The lungs are clear. The pulmonary vasculature is normal. There is no pleural effusion. Hilar, mediastinal and cardiac contours are unremarkable and unchanged. IMPRESSION: Marked hyperinflation.  No consolidation or effusion.  Electronically Signed   By: Ellery Plunkaniel R Mitchell M.D.   On: 12/05/2016 03:47    Procedures Procedures (including critical care time) CRITICAL CARE Performed by: XBJYN,WGNFAGLICK,Una Yeomans Total critical care time: 40 minutes Critical care time was exclusive of separately billable procedures and treating other patients. Critical care was necessary to treat or prevent imminent or life-threatening deterioration. Critical care was time spent personally by me on the following activities: development of treatment plan with patient and/or surrogate as well as nursing, discussions with consultants, evaluation of patient's response to treatment, examination of patient, obtaining history from patient or surrogate, ordering and performing treatments and interventions, ordering and review of laboratory studies, ordering and review of radiographic studies, pulse oximetry and re-evaluation of patient's condition.  Medications Ordered in ED Medications - No data to display   Initial Impression / Assessment and Plan / ED Course  Randy Boozeavid Lylah Lantis, MD has reviewed the triage vital signs and the nursing notes.  Pertinent labs & imaging results that were available during my care of the patient were reviewed by me and considered in my medical decision making (see chart for details).  Clinical Course    COPD exacerbation. Old records are reviewed, and it is noted that he has outpatient visits related to coronary artery disease but no relevant ED visits. Chest x-ray shows no evidence of pneumonia. He is given a series of 3 albuterol with ipratropium nebulizer treatments with modest improvement. Wheezing was decreased but he still had wheezy cough present. He was still mildly dyspneic, although improved. ase is discussed with Dr.  Toniann FailKakrakandy of [facility] agrees to admit the patient under observation status.  Final Clinical Impressions(s) / ED Diagnoses   Final diagnoses:  COPD exacerbation (HCC)    New Prescriptions New  Prescriptions   No medications on file   I personally performed the services described in this documentation, which was scribed in my presence. The recorded information has been reviewed and is accurate.       Randy Boozeavid Robel Wuertz, MD 12/05/16 [id number]    Randy Boozeavid Zeb Rawl, MD 12/05/16 [phone]

## 2016-12-05 NOTE — H&P (Signed)
[facility] History and Physical  Randy DistanceRonald L Trolinger WUJ:[id number]:[id number] DOB: 04/05/1946 DOA: 12/05/2016  Referring physician: PCP: Delorse LekBURNETT,BRENT A, MD   Chief Complaint: "I got so I couldn't walk across the room without getting short of breath."  HPI: Randy Kirk is a 70 y.o. male with past medical history significant for coronary artery disease, COPD, dyslipidemia, high blood pressure, ischemic heart disease and myocardial infarction presents emergency room with shortness of breath. Patient states that he's had shortness of breath for the last week. It has progressively got worse. This is been accompanied by a scantly productive cough. Patient has been using his inhalers. Patient had not sought medical treatment for this. Patient has no sick contacts. Patient does have a recent flu vaccine. Patient's pneumonia vaccinations today. Patient had 5 days of chills. Patient denies any fever. Patient states he was going to see his doctor this morning but his shortness of breath got so bad in the early morning that he activated EMS. EMS gave patient a dose of IV steroids and 2 breathing treatments with minimal improvement. In the emergency room patient received 3 treatments of albuterol, one hour long treatment with minimal improvement in his breathing status.  ED course: 3 albuterol treatments. 2 Atrovent treatments. Hyperinflated chest x-ray. Negative troponin 1. Hospitalist called for admission.  Review of Systems:  As per HPI otherwise 10 point review of systems negative.    Past Medical History:  Diagnosis Date  . CAD (coronary artery disease)   . COPD (chronic obstructive pulmonary disease) (HCC)   . Dyslipidemia   . Dyspnea   . Hyperlipidemia   . Hypertension   . Hypotension   . Ischemic heart disease   . Leukocytosis   . Myocardial infarction    Past Surgical History:  Procedure Laterality Date  . LEFT HEART CATH  2001   with selective coronary angiography  . PERCUTANEOUS  CORONARY STENT INTERVENTION (PCI-S)  2001   of the mid LAD   Social History:  reports that he quit smoking about 4 years ago. His smoking use included Cigarettes. He quit after 20.00 years of use. He has never used smokeless tobacco. He reports that he does not drink alcohol or use drugs.  Allergies  Allergen Reactions  . Penicillins     swelling    Family History  Problem Relation Age of Onset  . Family history unknown: Yes     Prior to Admission medications   Medication Sig Start Date End Date Taking? Authorizing Provider  aspirin EC 81 MG tablet Take 1 tablet (81 mg total) by mouth daily. 05/18/15  Yes Marinus MawGregg W Taylor, MD  atorvastatin (LIPITOR) 20 MG tablet Take 1 tablet (20 mg total) by mouth daily. 08/29/16  Yes Marinus MawGregg W Taylor, MD  budesonide-formoterol [facility](SYMBICORT) 160-4.5 MCG/ACT inhaler Inhale 2 puffs into the lungs 2 (two) times daily.   Yes Historical Provider, MD  metoprolol tartrate (LOPRESSOR) 25 MG tablet TAKE 1 TABLET (25 MG TOTAL) BY MOUTH 2 (TWO) TIMES DAILY. 09/03/16  Yes Marinus MawGregg W Taylor, MD  nitroGLYCERIN (NITROSTAT) 0.4 MG SL tablet Place 1 tablet (0.4 mg total) under the tongue every 5 (five) minutes as needed for chest pain. 05/18/15 01/03/17 Yes Marinus MawGregg W Taylor, MD  PROAIR HFA 108 [phone](90 Base) MCG/ACT inhaler INHALE 1 TO 2 PUFFS EVERY 4 TO 6 HOURS AS NEEDED. Patient taking differently: INHALE 1 TO 2 PUFFS EVERY 4 TO 6 HOURS AS NEEDED FOR SHORTNESS OF BREATH. 09/03/16  Yes Marinus MawGregg W Taylor, MD  Physical Exam: Vitals:   12/05/16 0615 12/05/16 0620 12/05/16 0630 12/05/16 0700  BP: 138/68  122/78 (!) 116/47  Pulse: (!) 145  (!) 127 (!) 133  Resp: 25  23 19   Temp:      TempSrc:      SpO2: 94% 96% 95% 91%    Wt Readings from Last 3 Encounters:  08/29/16 58.9 kg (129 lb 12.8 oz)  05/18/15 67.9 kg (149 lb 9.6 oz)  03/29/14 68.9 kg (152 lb)    General:  Appears calm and comfortable Eyes:  PERRL, EOMI, normal lids, iris ENT:  grossly normal hearing, lips & tongue Neck:  no  LAD, masses or thyromegaly Cardiovascular:  RRR, no m/r/g. 2+ pitting edema on the right 1+ pitting edema on the left Respiratory:  Crackles on the left upper lobe, no rhonchi, diffuse wheezing, little air movement, increased work of breathing with tachypnea, patient is sats into the 80s while on nasal cannula with conversation, lowest value 86% Abdomen:  soft, ntnd Skin:  no rash or induration seen on limited exam Musculoskeletal:  grossly normal tone BUE/BLE Psychiatric:  grossly normal mood and affect, speech fluent and appropriate Neurologic:  CN 2-12 grossly intact, moves all extremities in coordinated fashion.          Labs on Admission:  Basic Metabolic Panel:  Recent Labs Lab 12/05/16 0336  NA 136  K 3.7  CL 105  CO2 21*  GLUCOSE 96  BUN 16  CREATININE 0.84  CALCIUM 9.2   Liver Function Tests: No results for input(s): AST, ALT, ALKPHOS, BILITOT, PROT, ALBUMIN in the last 168 hours. No results for input(s): LIPASE, AMYLASE in the last 168 hours. No results for input(s): AMMONIA in the last 168 hours. CBC:  Recent Labs Lab 12/05/16 0336  WBC 12.2*  NEUTROABS 8.8*  HGB 16.5  HCT 45.3  MCV 92.1  PLT 259   Cardiac Enzymes:  Recent Labs Lab 12/05/16 0336  TROPONINI <0.03    BNP (last 3 results)  Recent Labs  12/05/16 0336  BNP 29.4    ProBNP (last 3 results) No results for input(s): PROBNP in the last 8760 hours.   Creatinine clearance cannot be calculated (Unknown ideal weight.)  CBG: No results for input(s): GLUCAP in the last 168 hours.  Radiological Exams on Admission: Dg Chest Port 1 View  Result Date: 12/05/2016 CLINICAL DATA:  Dyspnea tonight EXAM: PORTABLE CHEST 1 VIEW COMPARISON:  03/13/2014 FINDINGS: Marked unchanged hyperinflation. The lungs are clear. The pulmonary vasculature is normal. There is no pleural effusion. Hilar, mediastinal and cardiac contours are unremarkable and unchanged. IMPRESSION: Marked hyperinflation.  No  consolidation or effusion. Electronically Signed   By: Ellery Plunk M.D.   On: 12/05/2016 03:47    EKG: Independently reviewed. Ventricular rate 112, PR interval 168, QRS duration 76, QTC 455; sinus tachycardia with septal infarct age undetermined, compared to EKG from August 2017 no acute ST changes   Assessment/Plan Principal Problem:   COPD exacerbation (HCC) Active Problems:   Essential hypertension   Chronic ischemic heart disease   Hyperlipidemia   Mild dehydration   History of weight loss  COPD exacerbation Scheduled DuoNeb's When necessary albuterol Antibiotic- doxy Oxygen therapy Continuous pulse oximetry CXR tomorrow AM --> volume status may be masking pna Hold symbicort--> budesonide ordered Compalined of several days of chills - > influenza panel  Mild dehydration Slow bolus ordered  Hypertension When necessary hydralazine 10 mg IV as needed for severe blood pressure Cont lopressor bid  CAD ASA 81mg  qd Ntg prn CP  Weight loss Nutrition consult This is been going on for 2 years Patient feels it is worse over the last 3 months  Hyperlipidemia Continue statin  Tobacco abuse Nicotine patch   Code Status: DNR DVT Prophylaxis: Lovenox Family Communication: Son at bedside Disposition Plan: Pending Improvement    Haydee SalterPhillip M Johnnell Liou, MD Family Medicine [facility] [email or web address] Password [email or web address]

## 2016-12-05 NOTE — ED Notes (Signed)
Attempted report 

## 2016-12-05 NOTE — ED Triage Notes (Signed)
[facility] coming from [facility]. Called out for SOB. Wheezing throughout and Rhoci in lower lobes. Feeling worse. after last few days. COPD. 98% on 4 L [place]. Pt received 125 sol and 2 albuterol.  20GLH

## 2016-12-05 NOTE — ED Notes (Signed)
Attempted report x 2 

## 2016-12-06 ENCOUNTER — Observation Stay (HOSPITAL_COMMUNITY): Payer: Medicare Other

## 2016-12-06 DIAGNOSIS — J441 Chronic obstructive pulmonary disease with (acute) exacerbation: Principal | ICD-10-CM

## 2016-12-06 LAB — CBC
HCT: 36.8 % — ABNORMAL LOW (ref 39.0–52.0)
Hemoglobin: 12.7 g/dL — ABNORMAL LOW (ref 13.0–17.0)
MCH: 32 pg (ref 26.0–34.0)
MCHC: 34.5 g/dL (ref 30.0–36.0)
MCV: 92.7 fL (ref 78.0–100.0)
PLATELETS: 205 10*3/uL (ref 150–400)
RBC: 3.97 MIL/uL — ABNORMAL LOW (ref 4.22–5.81)
RDW: 13.2 % (ref 11.5–15.5)
WBC: 19.5 10*3/uL — AB (ref 4.0–10.5)

## 2016-12-06 LAB — BASIC METABOLIC PANEL
Anion gap: 9 (ref 5–15)
BUN: 17 mg/dL (ref 6–20)
CO2: 23 mmol/L (ref 22–32)
CREATININE: 0.76 mg/dL (ref 0.61–1.24)
Calcium: 8.7 mg/dL — ABNORMAL LOW (ref 8.9–10.3)
Chloride: 101 mmol/L (ref 101–111)
Glucose, Bld: 89 mg/dL (ref 65–99)
POTASSIUM: 3.7 mmol/L (ref 3.5–5.1)
SODIUM: 133 mmol/L — AB (ref 135–145)

## 2016-12-06 MED ORDER — ENSURE ENLIVE PO LIQD
237.0000 mL | Freq: Two times a day (BID) | ORAL | Status: DC
  Administered 2016-12-06 – 2016-12-09 (×5): 237 mL via ORAL

## 2016-12-06 MED ORDER — ALBUTEROL SULFATE (2.5 MG/3ML) 0.083% IN NEBU
2.5000 mg | INHALATION_SOLUTION | RESPIRATORY_TRACT | Status: DC | PRN
  Administered 2016-12-06 – 2016-12-07 (×2): 2.5 mg via RESPIRATORY_TRACT
  Filled 2016-12-06 (×2): qty 3

## 2016-12-06 MED ORDER — ADULT MULTIVITAMIN W/MINERALS CH
1.0000 | ORAL_TABLET | Freq: Every day | ORAL | Status: DC
  Administered 2016-12-07 – 2016-12-09 (×3): 1 via ORAL
  Filled 2016-12-06 (×3): qty 1

## 2016-12-06 NOTE — Progress Notes (Signed)
Initial Nutrition Assessment   INTERVENTION:  Provide Ensure Enlive po BID, each supplement provides 350 kcal and 20 grams of protein Provide Multivitamin with minerals daily   NUTRITION DIAGNOSIS:   Increased nutrient needs related to chronic illness as evidenced by estimated needs.   GOAL:   Patient will meet greater than or equal to 90% of their needs   MONITOR:   PO intake, Supplement acceptance, Labs, Skin, I & O's, Weight trends  REASON FOR ASSESSMENT:   Consult Assessment of nutrition requirement/status  ASSESSMENT:   70 y.o. male with past medical history significant for coronary artery disease, COPD, dyslipidemia, high blood pressure, ischemic heart disease and myocardial infarction presents emergency room with shortness of breath. Patient states that he's had shortness of breath for the last week. It has progressively got worse.   Pt asleep at time of visit. Family ate bedside report that patient was eating poorly for a few days PTA due to not feeling well, but he has been eating well since admission. Pt drinks nutritional supplements occasionally at home; he is encouraged by family to drink them more often. Per weight history, pt used to weigh 160 lbs, lost down to 129 lbs and has since regained 6 lbs. Per nursing notes, pt ate 100% of breakfast this morning.   Labs: low sodium, low calcium  Diet Order:  Diet regular Room service appropriate? Yes; Fluid consistency: Thin  Skin:  Reviewed, no issues  Last BM:  12/6  Height:   Ht Readings from Last 1 Encounters:  12/05/16 5\' 9"  (1.753 m)    Weight:   Wt Readings from Last 1 Encounters:  12/06/16 135 lb 6.4 oz (61.4 kg)    Ideal Body Weight:  72.7 kg  BMI:  Body mass index is 20 kg/m.  Estimated Nutritional Needs:   Kcal:  1800-2000  Protein:  90-105 grams  Fluid:  1.8 L/day  EDUCATION NEEDS:   No education needs identified at this time  Dorothea Ogleeanne Ellarose Brandi RD, CSP, LDN Inpatient Clinical  Dietitian Pager: [phone] After Hours Pager: [phone]

## 2016-12-06 NOTE — Care Management Obs Status (Signed)
MEDICARE OBSERVATION STATUS NOTIFICATION   Patient Details  Name: Randy Kirk MRN: [id number] Date of Birth: 10/17/1946   Medicare Observation Status Notification Given:  Yes    Darrold SpanWebster, Keonta Monceaux Hall, RN 12/06/2016, 3:05 PM

## 2016-12-06 NOTE — Care Management Note (Signed)
Case Management Note Donn PieriniKristi Srija Southard RN, BSN Unit 2W-Case Manager [phone]  Patient Details  Name: Randy Kirk MRN: [id number] Date of Birth: 01/09/1946  Subjective/Objective:   Pt admitted with COPD                 Action/Plan: PTA pt lived at home- anticipate return home- per pt he does not have home 02, he does state however that he has a nebulizer at home but has never been placed on neb. Medication. - CM will follow for d/c needs  Expected Discharge Date:                  Expected Discharge Plan:  Home/Self Care  In-House Referral:     Discharge planning Services  CM Consult  Post Acute Care Choice:    Choice offered to:     DME Arranged:    DME Agency:     HH Arranged:    HH Agency:     Status of Service:  In process, will continue to follow  If discussed at Long Length of Stay Meetings, dates discussed:    Additional Comments:  Randy Kirk, Randy Bultema Hall, RN 12/06/2016, 3:57 PM

## 2016-12-06 NOTE — Progress Notes (Signed)
SATURATION QUALIFICATIONS: (This note is used to comply with regulatory documentation for home oxygen)  Patient Saturations on Room Air at Rest = 93%  Patient Saturations on Room Air while Ambulating = 88%  Patient Saturations on 6 Liters of oxygen while Ambulating = 93%  Please briefly explain why patient needs home oxygen:

## 2016-12-06 NOTE — Progress Notes (Addendum)
Patient ID: Randy DistanceRonald L Kirk, male   DOB: 11/13/1946, 70 y.o.   MRN: [id number]    PROGRESS NOTE    Randy Kirk  [id number] DOB: 06/23/1946 DOA: 12/05/2016  PCP: Delorse LekBURNETT,BRENT A, MD   Brief Narrative:  Pt is 70 yo male with known coronary artery disease, COPD, dyslipidemia, high blood pressure, ischemic heart disease and myocardial infarction, presented with main concern of one week duration of progressively worsening dyspnea that initially started with exertion and has progressed to dyspnea at rest in the past 24 hours, associated with mostly productive cough of clear sputum, wheezing, chills.   Assessment & Plan:   Principal Problem:   Acute respiratory failure with hypoxia due to acute COPD exacerbation (HCC) - pt reports feeling better since admission - still unable to taper off oxygen this AM, currently on 2 L but desaturates to 80's if taken off oxygen  - will continue same regimen for now, BD's scheduled and as needed - continue empiric abx doxycycline day #2 - continue Prednisone   Active Problems:   Essential hypertension - reasonable inpatient control  - continue Metoprolol     Chronic ischemic heart disease - no chest pain this AM     HLD - continue statin   DVT prophylaxis: Lovenox SQ Code Status: DNR Family Communication: Patient and wife at bedside  Disposition Plan: Home in AM  Consultants:   None  Procedures:   None  Antimicrobials:   Doxycycline 12/07 -->  Subjective: Pt reports feeling better, still with exertional dyspnea.   Objective: Vitals:   12/05/16 2030 12/06/16 0216 12/06/16 0501 12/06/16 0743  BP: (!) 109/54  (!) 120/56   Pulse: 76  81   Resp: (!) 21  18   Temp: 97.6 F (36.4 C)  98 F (36.7 C)   TempSrc: Oral  Oral   SpO2: 95% 95% 94% 94%  Weight:   61.4 kg (135 lb 6.4 oz)   Height:        Intake/Output Summary (Last 24 hours) at 12/06/16 0846 Last data filed at 12/06/16 0457  Gross per 24 hour  Intake              1000 ml  Output             1350 ml  Net             -350 ml   Filed Weights   12/05/16 1554 12/06/16 0501  Weight: 62.4 kg (137 lb 8 oz) 61.4 kg (135 lb 6.4 oz)    Examination:  General exam: Appears calm and comfortable  Respiratory system: Respiratory effort normal, minimal exp wheezing in the upper lobes Cardiovascular system: S1 & S2 heard, RRR. No JVD, murmurs, rubs, gallops or clicks. No pedal edema. Gastrointestinal system: Abdomen is nondistended, soft and nontender. No organomegaly or masses felt. Normal bowel sounds heard. Central nervous system: Alert and oriented. No focal neurological deficits.  Data Reviewed: I have personally reviewed following labs and imaging studies  CBC:  Recent Labs Lab 12/05/16 0336 12/05/16 0850 12/06/16 0306  WBC 12.2* 10.5 19.5*  NEUTROABS 8.8*  --   --   HGB 16.5 14.7 12.7*  HCT 45.3 41.7 36.8*  MCV 92.1 91.9 92.7  PLT 259 252 205   Basic Metabolic Panel:  Recent Labs Lab 12/05/16 0336 12/05/16 0850 12/06/16 0306  NA 136  --  133*  K 3.7  --  3.7  CL 105  --  101  CO2 21*  --  23  GLUCOSE 96  --  89  BUN 16  --  17  CREATININE 0.84 0.92 0.76  CALCIUM 9.2  --  8.7*   Cardiac Enzymes:  Recent Labs Lab 12/05/16 0336  TROPONINI <0.03   Radiology Studies: Dg Chest 2 View  Result Date: 12/06/2016 CLINICAL DATA:  70 year old male with a history of COPD exacerbation EXAM: CHEST  2 VIEW COMPARISON:  12/05/2016, 03/13/2014 FINDINGS: Cardiomediastinal silhouette unchanged in size and contour. Calcifications of the aortic arch. No evidence of central vascular congestion. Stigmata of emphysema, with increased retrosternal airspace, flattened hemidiaphragms, increased AP diameter, and hyperinflation on the AP view. Changes of architectural distortion, similar to the comparison. No interlobular septal thickening. No confluent airspace disease. No pleural effusion or pneumothorax. No displaced fracture. Degenerative changes of the  spine. IMPRESSION: Advanced changes of COPD with no evidence of superimposed acute cardiopulmonary disease. Aortic atherosclerosis. Signed, Yvone NeuJaime S. Loreta AveWagner, DO [facility] Electronically Signed   By: Gilmer MorJaime  Wagner D.O.   On: 12/06/2016 08:38   Dg Chest Port 1 View  Result Date: 12/05/2016 CLINICAL DATA:  Dyspnea tonight EXAM: PORTABLE CHEST 1 VIEW COMPARISON:  03/13/2014 FINDINGS: Marked unchanged hyperinflation. The lungs are clear. The pulmonary vasculature is normal. There is no pleural effusion. Hilar, mediastinal and cardiac contours are unremarkable and unchanged. IMPRESSION: Marked hyperinflation.  No consolidation or effusion. Electronically Signed   By: Ellery Plunkaniel R Mitchell M.D.   On: 12/05/2016 03:47    Scheduled Meds: . aspirin EC  81 mg Oral Daily  . atorvastatin  20 mg Oral Daily  . budesonide  0.5 mg Inhalation BID  . doxycycline  100 mg Oral Q12H  . enoxaparin (LOVENOX) injection  40 mg Subcutaneous Q24H  . guaiFENesin  600 mg Oral BID  . ipratropium-albuterol  3 mL Nebulization Q6H  . metoprolol tartrate  25 mg Oral BID  . nicotine  7 mg Transdermal Daily  . predniSONE  40 mg Oral Q breakfast   Continuous Infusions:   LOS: 0 days   Time spent: 20 minutes   Debbora PrestoMAGICK-Trayce Caravello, MD [facility] Pager [phone]  If 7PM-7AM, please contact night-coverage [email or web address] Password [email or web address] 12/06/2016, 8:46 AM

## 2016-12-06 NOTE — Progress Notes (Signed)
Pt alert and oriented X 4.  VSS.  Denies pain. Pleasant.  Family at bedside.

## 2016-12-07 DIAGNOSIS — Z955 Presence of coronary angioplasty implant and graft: Secondary | ICD-10-CM | POA: Diagnosis not present

## 2016-12-07 DIAGNOSIS — Z7951 Long term (current) use of inhaled steroids: Secondary | ICD-10-CM | POA: Diagnosis not present

## 2016-12-07 DIAGNOSIS — E785 Hyperlipidemia, unspecified: Secondary | ICD-10-CM | POA: Diagnosis present

## 2016-12-07 DIAGNOSIS — Z79899 Other long term (current) drug therapy: Secondary | ICD-10-CM | POA: Diagnosis not present

## 2016-12-07 DIAGNOSIS — Z66 Do not resuscitate: Secondary | ICD-10-CM | POA: Diagnosis present

## 2016-12-07 DIAGNOSIS — I10 Essential (primary) hypertension: Secondary | ICD-10-CM | POA: Diagnosis present

## 2016-12-07 DIAGNOSIS — J9601 Acute respiratory failure with hypoxia: Secondary | ICD-10-CM | POA: Diagnosis present

## 2016-12-07 DIAGNOSIS — R0602 Shortness of breath: Secondary | ICD-10-CM | POA: Diagnosis present

## 2016-12-07 DIAGNOSIS — E860 Dehydration: Secondary | ICD-10-CM | POA: Diagnosis present

## 2016-12-07 DIAGNOSIS — Z87891 Personal history of nicotine dependence: Secondary | ICD-10-CM | POA: Diagnosis not present

## 2016-12-07 DIAGNOSIS — Z7982 Long term (current) use of aspirin: Secondary | ICD-10-CM | POA: Diagnosis not present

## 2016-12-07 DIAGNOSIS — I2510 Atherosclerotic heart disease of native coronary artery without angina pectoris: Secondary | ICD-10-CM | POA: Diagnosis present

## 2016-12-07 DIAGNOSIS — I252 Old myocardial infarction: Secondary | ICD-10-CM | POA: Diagnosis not present

## 2016-12-07 DIAGNOSIS — J441 Chronic obstructive pulmonary disease with (acute) exacerbation: Secondary | ICD-10-CM | POA: Diagnosis not present

## 2016-12-07 DIAGNOSIS — Z880 Allergy status to penicillin: Secondary | ICD-10-CM | POA: Diagnosis not present

## 2016-12-07 DIAGNOSIS — R634 Abnormal weight loss: Secondary | ICD-10-CM | POA: Diagnosis present

## 2016-12-07 LAB — CBC
HCT: 39.2 % (ref 39.0–52.0)
Hemoglobin: 13.7 g/dL (ref 13.0–17.0)
MCH: 32.2 pg (ref 26.0–34.0)
MCHC: 34.9 g/dL (ref 30.0–36.0)
MCV: 92 fL (ref 78.0–100.0)
PLATELETS: 245 10*3/uL (ref 150–400)
RBC: 4.26 MIL/uL (ref 4.22–5.81)
RDW: 12.9 % (ref 11.5–15.5)
WBC: 14.8 10*3/uL — AB (ref 4.0–10.5)

## 2016-12-07 LAB — BASIC METABOLIC PANEL
ANION GAP: 8 (ref 5–15)
BUN: 20 mg/dL (ref 6–20)
CALCIUM: 8.9 mg/dL (ref 8.9–10.3)
CO2: 25 mmol/L (ref 22–32)
Chloride: 103 mmol/L (ref 101–111)
Creatinine, Ser: 0.69 mg/dL (ref 0.61–1.24)
GFR calc Af Amer: >60 mL/min (ref >60)
GLUCOSE: 93 mg/dL (ref 65–99)
POTASSIUM: 3.6 mmol/L (ref 3.5–5.1)
SODIUM: 136 mmol/L (ref 135–145)

## 2016-12-07 MED ORDER — METHYLPREDNISOLONE SODIUM SUCC 40 MG IJ SOLR
40.0000 mg | Freq: Two times a day (BID) | INTRAMUSCULAR | Status: DC
  Administered 2016-12-07 – 2016-12-09 (×5): 40 mg via INTRAVENOUS
  Filled 2016-12-07 (×5): qty 1

## 2016-12-07 MED ORDER — IPRATROPIUM-ALBUTEROL 0.5-2.5 (3) MG/3ML IN SOLN
3.0000 mL | Freq: Three times a day (TID) | RESPIRATORY_TRACT | Status: DC
  Administered 2016-12-07 – 2016-12-09 (×8): 3 mL via RESPIRATORY_TRACT
  Filled 2016-12-07 (×8): qty 3

## 2016-12-07 NOTE — Progress Notes (Signed)
Patient ID: Randy Kirk, male   DOB: 02/05/1946, 70 y.o.   MRN: [id number]    PROGRESS NOTE    Randy DistanceRonald L Kirk  [id number] DOB: 05/21/1946 DOA: 12/05/2016  PCP: Delorse LekBURNETT,BRENT A, MD   Brief Narrative:  Pt is 70 yo male with known coronary artery disease, COPD, dyslipidemia, high blood pressure, ischemic heart disease and myocardial infarction, presented with main concern of one week duration of progressively worsening dyspnea that initially started with exertion and has progressed to dyspnea at rest in the past 24 hours, associated with mostly productive cough of clear sputum, wheezing, chills.   Assessment & Plan:   Principal Problem:   Acute respiratory failure with hypoxia due to acute COPD exacerbation (HCC) - pt reports feeling better since admission but still wheezing on exam  - still unable to taper off oxygen this AM, desaturates to 80's if taken off oxygen  - will continue same regimen for now, BD's scheduled and as needed - continue empiric abx doxycycline day #3 - change Prednisone to Solumedrol to see if this will help   Active Problems:   Essential hypertension - reasonable inpatient control  - continue Metoprolol     Chronic ischemic heart disease - no chest pain this AM     HLD - continue statin   DVT prophylaxis: Lovenox SQ Code Status: DNR Family Communication: Patient and wife at bedside  Disposition Plan: Home in 1-2 days depends if able to take of Solumedrol   Consultants:   None  Procedures:   None  Antimicrobials:   Doxycycline 12/07 -->  Subjective: Pt reports feeling better, still with exertional dyspnea.   Objective: Vitals:   12/07/16 0435 12/07/16 0441 12/07/16 0906 12/07/16 0911  BP:  (!) 142/78 125/62   Pulse:  71 73 77  Resp:  18 18 18   Temp:  97.8 F (36.6 C) 97.7 F (36.5 C)   TempSrc:  Oral Oral   SpO2: 97% 92% 97% 98%  Weight:  62.8 kg (138 lb 6.4 oz)    Height:        Intake/Output Summary (Last 24 hours) at  12/07/16 1114 Last data filed at 12/07/16 0140  Gross per 24 hour  Intake              360 ml  Output              400 ml  Net              -40 ml   Filed Weights   12/05/16 1554 12/06/16 0501 12/07/16 0441  Weight: 62.4 kg (137 lb 8 oz) 61.4 kg (135 lb 6.4 oz) 62.8 kg (138 lb 6.4 oz)    Examination:  General exam: Appears calm and comfortable  Respiratory system: Respiratory effort normal, exp wheezing in the upper lobes Cardiovascular system: S1 & S2 heard, RRR. No JVD, murmurs, rubs, gallops or clicks. No pedal edema. Gastrointestinal system: Abdomen is nondistended, soft and nontender. No organomegaly or masses felt. Normal bowel sounds heard. Central nervous system: Alert and oriented. No focal neurological deficits.  Data Reviewed: I have personally reviewed following labs and imaging studies  CBC:  Recent Labs Lab 12/05/16 0336 12/05/16 0850 12/06/16 0306 12/07/16 0320  WBC 12.2* 10.5 19.5* 14.8*  NEUTROABS 8.8*  --   --   --   HGB 16.5 14.7 12.7* 13.7  HCT 45.3 41.7 36.8* 39.2  MCV 92.1 91.9 92.7 92.0  PLT 259 252 205 245   Basic Metabolic  Panel:  Recent Labs Lab 12/05/16 0336 12/05/16 0850 12/06/16 0306 12/07/16 0320  NA 136  --  133* 136  K 3.7  --  3.7 3.6  CL 105  --  101 103  CO2 21*  --  23 25  GLUCOSE 96  --  89 93  BUN 16  --  17 20  CREATININE 0.84 0.92 0.76 0.69  CALCIUM 9.2  --  8.7* 8.9   Cardiac Enzymes:  Recent Labs Lab 12/05/16 0336  TROPONINI <0.03   Radiology Studies: Dg Chest 2 View  Result Date: 12/06/2016 CLINICAL DATA:  70 year old male with a history of COPD exacerbation EXAM: CHEST  2 VIEW COMPARISON:  12/05/2016, 03/13/2014 FINDINGS: Cardiomediastinal silhouette unchanged in size and contour. Calcifications of the aortic arch. No evidence of central vascular congestion. Stigmata of emphysema, with increased retrosternal airspace, flattened hemidiaphragms, increased AP diameter, and hyperinflation on the AP view. Changes  of architectural distortion, similar to the comparison. No interlobular septal thickening. No confluent airspace disease. No pleural effusion or pneumothorax. No displaced fracture. Degenerative changes of the spine. IMPRESSION: Advanced changes of COPD with no evidence of superimposed acute cardiopulmonary disease. Aortic atherosclerosis. Signed, Yvone NeuJaime S. Loreta AveWagner, DO [facility] Electronically Signed   By: Gilmer MorJaime  Wagner D.O.   On: 12/06/2016 08:38    Scheduled Meds: . aspirin EC  81 mg Oral Daily  . atorvastatin  20 mg Oral Daily  . budesonide  0.5 mg Inhalation BID  . doxycycline  100 mg Oral Q12H  . enoxaparin (LOVENOX) injection  40 mg Subcutaneous Q24H  . feeding supplement (ENSURE ENLIVE)  237 mL Oral BID BM  . guaiFENesin  600 mg Oral BID  . ipratropium-albuterol  3 mL Nebulization TID  . metoprolol tartrate  25 mg Oral BID  . multivitamin with minerals  1 tablet Oral Daily  . nicotine  7 mg Transdermal Daily  . predniSONE  40 mg Oral Q breakfast   Continuous Infusions:   LOS: 0 days   Time spent: 20 minutes   Debbora PrestoMAGICK-MYERS, ISKRA, MD [facility] Pager [phone]  If 7PM-7AM, please contact night-coverage [email or web address] Password [email or web address] 12/07/2016, 11:14 AM

## 2016-12-07 NOTE — Progress Notes (Signed)
Patient ambulated 150 ft on oxygen 3L using a rolling Dross. Patient c/o of feeling ''windy'', patient was encouraged to do some deep breathing. Patient now in the room voiced feeling better, will continue to monitor

## 2016-12-08 LAB — CBC
HCT: 42.8 % (ref 39.0–52.0)
HEMOGLOBIN: 15.1 g/dL (ref 13.0–17.0)
MCH: 32.6 pg (ref 26.0–34.0)
MCHC: 35.3 g/dL (ref 30.0–36.0)
MCV: 92.4 fL (ref 78.0–100.0)
Platelets: 266 10*3/uL (ref 150–400)
RBC: 4.63 MIL/uL (ref 4.22–5.81)
RDW: 12.7 % (ref 11.5–15.5)
WBC: 9.1 10*3/uL (ref 4.0–10.5)

## 2016-12-08 LAB — BASIC METABOLIC PANEL
Anion gap: 9 (ref 5–15)
BUN: 16 mg/dL (ref 6–20)
CALCIUM: 9.3 mg/dL (ref 8.9–10.3)
CHLORIDE: 101 mmol/L (ref 101–111)
CO2: 27 mmol/L (ref 22–32)
CREATININE: 0.74 mg/dL (ref 0.61–1.24)
GFR calc Af Amer: >60 mL/min (ref >60)
GFR calc non Af Amer: >60 mL/min (ref >60)
Glucose, Bld: 123 mg/dL — ABNORMAL HIGH (ref 65–99)
Potassium: 4.5 mmol/L (ref 3.5–5.1)
SODIUM: 137 mmol/L (ref 135–145)

## 2016-12-08 NOTE — Progress Notes (Signed)
Patient ID: Randy DistanceRonald L Kirk, male   DOB: 02/02/1946, 70 y.o.   MRN: [id number]    PROGRESS NOTE    Randy DistanceRonald L Kirk  [id number] DOB: 06/24/1946 DOA: 12/05/2016  PCP: Delorse LekBURNETT,BRENT A, MD   Brief Narrative:  Pt is 70 yo male with known coronary artery disease, COPD, dyslipidemia, high blood pressure, ischemic heart disease and myocardial infarction, presented with main concern of one week duration of progressively worsening dyspnea that initially started with exertion and has progressed to dyspnea at rest in the past 24 hours, associated with mostly productive cough of clear sputum, wheezing, chills.   Assessment & Plan:   Principal Problem:   Acute respiratory failure with hypoxia due to acute COPD exacerbation (HCC) - pt reports feeling better since admission but still wheezing on exam  - still unable to taper off oxygen this AM, desaturates to 80's if taken off oxygen  - will continue same regimen for now, BD's scheduled and as needed - continue empiric abx doxycycline day #4/7 - continue solumedrol for now - flutter valve   Active Problems:   Essential hypertension - reasonable inpatient control  - continue Metoprolol     Chronic ischemic heart disease - no chest pain this AM     HLD - continue statin   DVT prophylaxis: Lovenox SQ Code Status: DNR Family Communication: Patient and wife at bedside  Disposition Plan: Home in 1-2 days depends if able to take of Solumedrol   Consultants:   None  Procedures:   None  Antimicrobials:   Doxycycline 12/07 -->  Subjective: Pt reports feeling better, still with exertional dyspnea.   Objective: Vitals:   12/07/16 2028 12/07/16 2150 12/08/16 0557 12/08/16 0645  BP: 126/71  123/69   Pulse: 77 76 66 72  Resp: 18 18 18 18   Temp: 97.6 F (36.4 C)  97.5 F (36.4 C)   TempSrc: Oral  Oral   SpO2: 97%  97%   Weight:   62 kg (136 lb 11.2 oz)   Height:        Intake/Output Summary (Last 24 hours) at 12/08/16 1112 Last  data filed at 12/08/16 [id number]  Gross per 24 hour  Intake             1080 ml  Output             3050 ml  Net            -1970 ml   Filed Weights   12/06/16 0501 12/07/16 0441 12/08/16 0557  Weight: 61.4 kg (135 lb 6.4 oz) 62.8 kg (138 lb 6.4 oz) 62 kg (136 lb 11.2 oz)    Examination:  General exam: Appears calm and comfortable  Respiratory system: Respiratory effort normal, exp wheezing in the upper lobes Cardiovascular system: S1 & S2 heard, RRR. No JVD, murmurs, rubs, gallops or clicks. No pedal edema. Gastrointestinal system: Abdomen is nondistended, soft and nontender. No organomegaly or masses felt. Normal bowel sounds heard. Central nervous system: Alert and oriented. No focal neurological deficits.  Data Reviewed: I have personally reviewed following labs and imaging studies  CBC:  Recent Labs Lab 12/05/16 0336 12/05/16 0850 12/06/16 0306 12/07/16 0320 12/08/16 0245  WBC 12.2* 10.5 19.5* 14.8* 9.1  NEUTROABS 8.8*  --   --   --   --   HGB 16.5 14.7 12.7* 13.7 15.1  HCT 45.3 41.7 36.8* 39.2 42.8  MCV 92.1 91.9 92.7 92.0 92.4  PLT 259 252 205 245 266  Basic Metabolic Panel:  Recent Labs Lab 12/05/16 0336 12/05/16 0850 12/06/16 0306 12/07/16 0320 12/08/16 0245  NA 136  --  133* 136 137  K 3.7  --  3.7 3.6 4.5  CL 105  --  101 103 101  CO2 21*  --  23 25 27   GLUCOSE 96  --  89 93 123*  BUN 16  --  17 20 16   CREATININE 0.84 0.92 0.76 0.69 0.74  CALCIUM 9.2  --  8.7* 8.9 9.3   Cardiac Enzymes:  Recent Labs Lab 12/05/16 0336  TROPONINI <0.03   Radiology Studies: No results found.  Scheduled Meds: . aspirin EC  81 mg Oral Daily  . atorvastatin  20 mg Oral Daily  . budesonide  0.5 mg Inhalation BID  . doxycycline  100 mg Oral Q12H  . enoxaparin (LOVENOX) injection  40 mg Subcutaneous Q24H  . feeding supplement (ENSURE ENLIVE)  237 mL Oral BID BM  . guaiFENesin  600 mg Oral BID  . ipratropium-albuterol  3 mL Nebulization TID  . methylPREDNISolone  (SOLU-MEDROL) injection  40 mg Intravenous Q12H  . metoprolol tartrate  25 mg Oral BID  . multivitamin with minerals  1 tablet Oral Daily  . nicotine  7 mg Transdermal Daily   Continuous Infusions:   LOS: 1 day   Time spent: 20 minutes   Debbora PrestoMAGICK-MYERS, ISKRA, MD [facility] Pager [phone]  If 7PM-7AM, please contact night-coverage [email or web address] Password [email or web address] 12/08/2016, 11:12 AM

## 2016-12-09 LAB — BASIC METABOLIC PANEL
ANION GAP: 7 (ref 5–15)
BUN: 17 mg/dL (ref 6–20)
CALCIUM: 8.8 mg/dL — AB (ref 8.9–10.3)
CO2: 27 mmol/L (ref 22–32)
Chloride: 102 mmol/L (ref 101–111)
Creatinine, Ser: 0.87 mg/dL (ref 0.61–1.24)
GLUCOSE: 116 mg/dL — AB (ref 65–99)
POTASSIUM: 4.4 mmol/L (ref 3.5–5.1)
SODIUM: 136 mmol/L (ref 135–145)

## 2016-12-09 LAB — CBC
HCT: 39.8 % (ref 39.0–52.0)
Hemoglobin: 14.1 g/dL (ref 13.0–17.0)
MCH: 32.5 pg (ref 26.0–34.0)
MCHC: 35.4 g/dL (ref 30.0–36.0)
MCV: 91.7 fL (ref 78.0–100.0)
PLATELETS: 249 10*3/uL (ref 150–400)
RBC: 4.34 MIL/uL (ref 4.22–5.81)
RDW: 12.6 % (ref 11.5–15.5)
WBC: 10.6 10*3/uL — AB (ref 4.0–10.5)

## 2016-12-09 MED ORDER — DOXYCYCLINE HYCLATE 100 MG PO TABS: 100.0000 mg | ORAL_TABLET | Freq: Two times a day (BID) | ORAL | 0 refills | Status: AC

## 2016-12-09 MED ORDER — GUAIFENESIN-DM 100-10 MG/5ML PO SYRP: 5.0000 mL | ORAL_SOLUTION | ORAL | 0 refills | Status: AC | PRN

## 2016-12-09 MED ORDER — IPRATROPIUM-ALBUTEROL 0.5-2.5 (3) MG/3ML IN SOLN: 3.0000 mL | RESPIRATORY_TRACT | 3 refills | Status: AC | PRN

## 2016-12-09 MED ORDER — PREDNISONE 10 MG PO TABS: ORAL_TABLET | ORAL | 0 refills | Status: AC

## 2016-12-09 MED ORDER — TRAZODONE HCL 50 MG PO TABS: 25.0000 mg | ORAL_TABLET | Freq: Every evening | ORAL | 0 refills | Status: AC | PRN

## 2016-12-09 MED ORDER — BUDESONIDE-FORMOTEROL FUMARATE 160-4.5 MCG/ACT IN AERO: 2.0000 | INHALATION_SPRAY | Freq: Two times a day (BID) | RESPIRATORY_TRACT | 1 refills | Status: AC

## 2016-12-09 MED ORDER — IBUPROFEN 400 MG PO TABS: 400.0000 mg | ORAL_TABLET | Freq: Four times a day (QID) | ORAL | 0 refills | Status: AC | PRN

## 2016-12-09 NOTE — Care Management Note (Signed)
Case Management Note Donn PieriniKristi Galadriel Shroff RN, BSN Unit 2W-Case Manager [phone]  Patient Details  Name: Randy DistanceRonald L Kirk MRN: [id number] Date of Birth: 11/24/1946  Subjective/Objective:   Pt admitted with COPD                 Action/Plan: PTA pt lived at home- anticipate return home- per pt he does not have home 02, he does state however that he has a nebulizer at home but has never been placed on neb. Medication. - CM will follow for d/c needs  Expected Discharge Date:      12/09/16            Expected Discharge Plan:  Home/Self Care  In-House Referral:     Discharge planning Services  CM Consult  Post Acute Care Choice:  Durable Medical Equipment Choice offered to:  Patient  DME Arranged:  Nebulizer machine, Oxygen DME Agency:  [facility]  [facility]     Status of Service:  Completed, signed off  If discussed at Long Length of Stay Meetings, dates discussed:    Additional Comments:  12/09/16- 1100- Yuval Nolet RN, CM- pt qualified for home 02 and will need new nebulizer for home- orders have been placed- spoke with pt at bedside[facility] that [facility] once home- [facility]Sh[facility]- portable tank to be delivered to room along with nebulizer prior to discharge.   Darrold SpanWebster, Aubrynn Katona Hall, RN 12/09/2016, 4:18 PM

## 2016-12-09 NOTE — Progress Notes (Signed)
Discharge instructions given. Pt verbalized understanding and all questions were given.  

## 2016-12-09 NOTE — Discharge Instructions (Signed)
Chronic Obstructive Pulmonary Disease Chronic obstructive pulmonary disease (COPD) is a common lung problem. In COPD, the flow of air from the lungs is limited. The way your lungs work will probably never return to normal, but there are things you can do to improve your lungs and make yourself feel better. Your doctor may treat your condition with:  Medicines.  Oxygen.  Lung surgery.  Changes to your diet.  Rehabilitation. This may involve a team of specialists. Follow these instructions at home:  Take all medicines as told by your doctor.  Avoid medicines or cough syrups that dry up your airway (such as antihistamines) and do not allow you to get rid of thick spit. You do not need to avoid them if told differently by your doctor.  If you smoke, stop. Smoking makes the problem worse.  Avoid being around things that make your breathing worse (like smoke, chemicals, and fumes).  Use oxygen therapy and therapy to help improve your lungs (pulmonary rehabilitation) if told by your doctor. If you need home oxygen therapy, ask your doctor if you should buy a tool to measure your oxygen level (oximeter).  Avoid people who have a sickness you can catch (contagious).  Avoid going outside when it is very hot, cold, or humid.  Eat healthy foods. Eat smaller meals more often. Rest before meals.  Stay active, but remember to also rest.  Make sure to get all the shots (vaccines) your doctor recommends. Ask your doctor if you need a pneumonia shot.  Learn and use tips on how to relax.  Learn and use tips on how to control your breathing as told by your doctor. Try: 1. Breathing in (inhaling) through your nose for 1 second. Then, pucker your lips and breath out (exhale) through your lips for 2 seconds. 2. Putting one hand on your belly (abdomen). Breathe in slowly through your nose for 1 second. Your hand on your belly should move out. Pucker your lips and breathe out slowly through your lips.  Your hand on your belly should move in as you breathe out.  Learn and use controlled coughing to clear thick spit from your lungs. The steps are: 1. Lean your head a little forward. 2. Breathe in deeply. 3. Try to hold your breath for 3 seconds. 4. Keep your mouth slightly open while coughing 2 times. 5. Spit any thick spit out into a tissue. 6. Rest and do the steps again 1 or 2 times as needed. Contact a doctor if:  You cough up more thick spit than usual.  There is a change in the color or thickness of the spit.  It is harder to breathe than usual.  Your breathing is faster than usual. Get help right away if:  You have shortness of breath while resting.  You have shortness of breath that stops you from:  Being able to talk.  Doing normal activities.  You chest hurts for longer than 5 minutes.  Your skin color is more blue than usual.  Your pulse oximeter shows that you have low oxygen for longer than 5 minutes. This information is not intended to replace advice given to you by your health care provider. Make sure you discuss any questions you have with your health care provider. Document Released: 06/03/2008 Document Revised: 05/23/2016 Document Reviewed: 08/12/2013 Elsevier Interactive Patient Education  2017 [employer]  

## 2016-12-09 NOTE — Discharge Summary (Signed)
Physician Discharge Summary  Koleen DistanceRonald L Mccormac ZOX:[id number] DOB: 10/20/1946 DOA: 12/05/2016  PCP: Delorse LekBURNETT,BRENT A, MD  Admit date: 12/05/2016 Discharge date: 12/09/2016  Recommendations for Outpatient Follow-up:  1. Pt will need to follow up with PCP in 1-2 weeks post discharge 2. Please also check CBC to evaluate WBC, to make sure it has stabilized  3. Pt advised to complete course of ABX and Prednisone taper upon discharge 4. Pt also given nebulizer machine on discharge   Discharge Diagnoses:  Principal Problem:   COPD exacerbation (HCC) Active Problems:   Essential hypertension   Chronic ischemic heart disease   Hyperlipidemia   Mild dehydration   History of weight loss   Discharge Condition: Stable  Diet recommendation: Heart healthy diet discussed in details   Brief Narrative:  Pt is 70 yo male with known coronary artery disease, COPD, dyslipidemia, high blood pressure, ischemic heart disease and myocardial infarction, presented with main concern of one week duration of progressively worsening dyspnea that initially started with exertion and has progressed to dyspnea at rest in the past 24 hours, associated with mostly productive cough of clear sputum, wheezing, chills.   Assessment & Plan:   Principal Problem:   Acute respiratory failure with hypoxia due to acute COPD exacerbation (HCC) - pt reports feeling better since admission, minimal wheezing on exam this AM  - still unable to taper off oxygen this AM, desaturates to 80's if taken off oxygen  - will continue same regimen for now, BD's as needed - ,ust complete therapy with ABX and prednisone taper   Active Problems:   Essential hypertension - reasonable inpatient control  - continue Metoprolol     Chronic ischemic heart disease - no chest pain this AM     HLD - continue statin   DVT prophylaxis: Lovenox SQ Code Status: DNR Family Communication: Patient and wife at bedside  Disposition Plan: Home    Consultants:   None  Procedures:   None  Antimicrobials:   Doxycycline 12/07 -->   Procedures/Studies: Dg Chest 2 View  Result Date: 12/06/2016 CLINICAL DATA:  70 year old male with a history of COPD exacerbation EXAM: CHEST  2 VIEW COMPARISON:  12/05/2016, 03/13/2014 FINDINGS: Cardiomediastinal silhouette unchanged in size and contour. Calcifications of the aortic arch. No evidence of central vascular congestion. Stigmata of emphysema, with increased retrosternal airspace, flattened hemidiaphragms, increased AP diameter, and hyperinflation on the AP view. Changes of architectural distortion, similar to the comparison. No interlobular septal thickening. No confluent airspace disease. No pleural effusion or pneumothorax. No displaced fracture. Degenerative changes of the spine. IMPRESSION: Advanced changes of COPD with no evidence of superimposed acute cardiopulmonary disease. Aortic atherosclerosis. Signed, Yvone NeuJaime S. Loreta AveWagner, DO [facility] Electronically Signed   By: Gilmer MorJaime  Wagner D.O.   On: 12/06/2016 08:38   Dg Chest Port 1 View  Result Date: 12/05/2016 CLINICAL DATA:  Dyspnea tonight EXAM: PORTABLE CHEST 1 VIEW COMPARISON:  03/13/2014 FINDINGS: Marked unchanged hyperinflation. The lungs are clear. The pulmonary vasculature is normal. There is no pleural effusion. Hilar, mediastinal and cardiac contours are unremarkable and unchanged. IMPRESSION: Marked hyperinflation.  No consolidation or effusion. Electronically Signed   By: Ellery Plunkaniel R Mitchell M.D.   On: 12/05/2016 03:47     Discharge Exam: Vitals:   12/08/16 2143 12/09/16 0418  BP: 131/78 125/90  Pulse:    Resp:    Temp: 97.8 F (36.6 C) 98.7 F (37.1 C)   Vitals:   12/08/16 2143 12/09/16  [id number] 12/09/16 0533 12/09/16 0807  BP: 131/78 125/90    Pulse:      Resp:      Temp: 97.8 F (36.6 C) 98.7 F (37.1 C)    TempSrc: Oral Oral    SpO2: 98% 96%  95%   Weight:   62.3 kg (137 lb 4.8 oz)   Height:        General: Pt is alert, follows commands appropriately, not in acute distress Cardiovascular: Regular rate and rhythm, S1/S2 +, no murmurs, no rubs, no gallops Respiratory: Clear to auscultation bilaterally, no crackles, no rhonchi Abdominal: Soft, non tender, non distended, bowel sounds +, no guarding Extremities: no edema, no cyanosis, pulses palpable bilaterally DP and PT Neuro: Grossly nonfocal  Discharge Instructions  Discharge Instructions    Diet - low sodium heart healthy    Complete by:  As directed    Increase activity slowly    Complete by:  As directed        Medication List    TAKE these medications   aspirin EC 81 MG tablet Take 1 tablet (81 mg total) by mouth daily.   atorvastatin 20 MG tablet Commonly known as:  LIPITOR Take 1 tablet (20 mg total) by mouth daily.   budesonide-formoterol 160-4.5 MCG/ACT inhaler Commonly known as:  SYMBICORT Inhale 2 puffs into the lungs 2 (two) times daily. What changed:  when to take this   doxycycline 100 MG tablet Commonly known as:  VIBRA-TABS Take 1 tablet (100 mg total) by mouth every 12 (twelve) hours.   guaiFENesin-dextromethorphan 100-10 MG/5ML syrup Commonly known as:  ROBITUSSIN DM Take 5 mLs by mouth every 4 (four) hours as needed for cough.   ibuprofen 400 MG tablet Commonly known as:  ADVIL,MOTRIN Take 1 tablet (400 mg total) by mouth every 6 (six) hours as needed for moderate pain.   ipratropium-albuterol 0.5-2.5 (3) MG/3ML Soln Commonly known as:  DUONEB Take 3 mLs by nebulization every 4 (four) hours as needed.   metoprolol tartrate 25 MG tablet Commonly known as:  LOPRESSOR TAKE 1 TABLET (25 MG TOTAL) BY MOUTH 2 (TWO) TIMES DAILY.   nitroGLYCERIN 0.4 MG SL tablet Commonly known as:  NITROSTAT Place 1 tablet (0.4 mg total) under the tongue every 5 (five) minutes as needed for chest pain.   predniSONE 10 MG tablet Commonly known as:   DELTASONE Take 50 mg tablet 12/10/2016 and taper down by 10 mg daily until completed   PROAIR HFA 108 (90 Base) MCG/ACT inhaler Generic drug:  albuterol INHALE 1 TO 2 PUFFS EVERY 4 TO 6 HOURS AS NEEDED. What changed:  See the new instructions.   traZODone 50 MG tablet Commonly known as:  DESYREL Take 0.5 tablets (25 mg total) by mouth at bedtime as needed for sleep.            Durable Medical Equipment        Start     Ordered   12/09/16 1029  For home use only DME oxygen  Once    Comments:  Please evaluate for light weight oxygen, easier for transport  Question Answer Comment  Mode or (Route) Nasal cannula   Liters per Minute 2   Frequency Continuous (stationary and portable oxygen unit needed)   Oxygen conserving device Yes   Oxygen delivery system Gas      12/09/16 1029   12/07/16 1104  For home use only DME Nebulizer machine  Once    Question:  Patient needs a nebulizer  to treat with the following condition  Answer:  COPD (chronic obstructive pulmonary disease) (HCC)   12/07/16 1103     Follow-up Information    BURNETT,BRENT A, MD Follow up.   Specialty:  Family Medicine Contact information: [address] [place] [address] [phone]        Debbora Presto, MD. Call.   Specialty:  Internal Medicine Why:  call my cell phone with questions [phone] Contact information: [address] [place] [address] [phone]            The results of significant diagnostics from this hospitalization (including imaging, microbiology, ancillary and laboratory) are listed below for reference.     Microbiology: No results found for this or any previous visit (from the past 240 hour(s)).   Labs: Basic Metabolic Panel:  Recent Labs Lab 12/05/16 0336 12/05/16 0850 12/06/16 0306 12/07/16 0320 12/08/16 0245 12/09/16 0355  NA 136  --  133* 136 137 136  K 3.7  --  3.7 3.6 4.5 4.4  CL 105  --  101 103 101  102  CO2 21*  --  23 25 27 27   GLUCOSE 96  --  89 93 123* 116*  BUN 16  --  17 20 16 17   CREATININE 0.84 0.92 0.76 0.69 0.74 0.87  CALCIUM 9.2  --  8.7* 8.9 9.3 8.8*   CBC:  Recent Labs Lab 12/05/16 0336 12/05/16 0850 12/06/16 0306 12/07/16 0320 12/08/16 0245 12/09/16 0355  WBC 12.2* 10.5 19.5* 14.8* 9.1 10.6*  NEUTROABS 8.8*  --   --   --   --   --   HGB 16.5 14.7 12.7* 13.7 15.1 14.1  HCT 45.3 41.7 36.8* 39.2 42.8 39.8  MCV 92.1 91.9 92.7 92.0 92.4 91.7  PLT 259 252 205 245 266 249   Cardiac Enzymes:  Recent Labs Lab 12/05/16 0336  TROPONINI <0.03   BNP: BNP (last 3 results)  Recent Labs  12/05/16 0336  BNP 29.4    SIGNED: Time coordinating discharge:  30 minutes  Debbora Presto, MD  [facility] 12/09/2016, 10:32 AM Pager [phone]  If 7PM-7AM, please contact night-coverage [email or web address] Password [email or web address]

## 2016-12-09 NOTE — Progress Notes (Signed)
SATURATION QUALIFICATIONS:  Patient Saturations on Room Air at Rest =96%  Patient Saturations on [employer] Air while Ambulating = 84%  Patient Saturations on 2 Liters of oxygen while Ambulating = 94%

## 2017-02-13 ENCOUNTER — Emergency Department (HOSPITAL_COMMUNITY): Payer: Medicare Other

## 2017-02-13 ENCOUNTER — Inpatient Hospital Stay (HOSPITAL_COMMUNITY)
Admission: EM | Admit: 2017-02-13 | Discharge: 2017-02-27 | DRG: 207 | Disposition: E | Payer: Medicare Other | Attending: Pulmonary Disease | Admitting: Pulmonary Disease

## 2017-02-13 ENCOUNTER — Encounter (HOSPITAL_COMMUNITY): Payer: Self-pay | Admitting: Emergency Medicine

## 2017-02-13 DIAGNOSIS — J9622 Acute and chronic respiratory failure with hypercapnia: Secondary | ICD-10-CM | POA: Diagnosis present

## 2017-02-13 DIAGNOSIS — I2510 Atherosclerotic heart disease of native coronary artery without angina pectoris: Secondary | ICD-10-CM | POA: Diagnosis present

## 2017-02-13 DIAGNOSIS — I959 Hypotension, unspecified: Secondary | ICD-10-CM | POA: Diagnosis not present

## 2017-02-13 DIAGNOSIS — E872 Acidosis: Secondary | ICD-10-CM | POA: Diagnosis present

## 2017-02-13 DIAGNOSIS — J9620 Acute and chronic respiratory failure, unspecified whether with hypoxia or hypercapnia: Secondary | ICD-10-CM | POA: Diagnosis present

## 2017-02-13 DIAGNOSIS — B9729 Other coronavirus as the cause of diseases classified elsewhere: Secondary | ICD-10-CM | POA: Diagnosis present

## 2017-02-13 DIAGNOSIS — Z9911 Dependence on respirator [ventilator] status: Secondary | ICD-10-CM

## 2017-02-13 DIAGNOSIS — E785 Hyperlipidemia, unspecified: Secondary | ICD-10-CM | POA: Diagnosis present

## 2017-02-13 DIAGNOSIS — I110 Hypertensive heart disease with heart failure: Secondary | ICD-10-CM | POA: Diagnosis present

## 2017-02-13 DIAGNOSIS — G9340 Encephalopathy, unspecified: Secondary | ICD-10-CM | POA: Diagnosis not present

## 2017-02-13 DIAGNOSIS — Z9981 Dependence on supplemental oxygen: Secondary | ICD-10-CM

## 2017-02-13 DIAGNOSIS — Z87891 Personal history of nicotine dependence: Secondary | ICD-10-CM

## 2017-02-13 DIAGNOSIS — I4891 Unspecified atrial fibrillation: Secondary | ICD-10-CM | POA: Diagnosis not present

## 2017-02-13 DIAGNOSIS — Z781 Physical restraint status: Secondary | ICD-10-CM

## 2017-02-13 DIAGNOSIS — Z66 Do not resuscitate: Secondary | ICD-10-CM | POA: Diagnosis present

## 2017-02-13 DIAGNOSIS — Z515 Encounter for palliative care: Secondary | ICD-10-CM | POA: Diagnosis present

## 2017-02-13 DIAGNOSIS — R0603 Acute respiratory distress: Secondary | ICD-10-CM | POA: Insufficient documentation

## 2017-02-13 DIAGNOSIS — Z880 Allergy status to penicillin: Secondary | ICD-10-CM

## 2017-02-13 DIAGNOSIS — J440 Chronic obstructive pulmonary disease with acute lower respiratory infection: Principal | ICD-10-CM | POA: Diagnosis present

## 2017-02-13 DIAGNOSIS — I248 Other forms of acute ischemic heart disease: Secondary | ICD-10-CM | POA: Diagnosis present

## 2017-02-13 DIAGNOSIS — J189 Pneumonia, unspecified organism: Secondary | ICD-10-CM | POA: Diagnosis present

## 2017-02-13 DIAGNOSIS — J90 Pleural effusion, not elsewhere classified: Secondary | ICD-10-CM

## 2017-02-13 DIAGNOSIS — D649 Anemia, unspecified: Secondary | ICD-10-CM | POA: Diagnosis present

## 2017-02-13 DIAGNOSIS — G9341 Metabolic encephalopathy: Secondary | ICD-10-CM | POA: Diagnosis present

## 2017-02-13 DIAGNOSIS — R451 Restlessness and agitation: Secondary | ICD-10-CM | POA: Diagnosis not present

## 2017-02-13 DIAGNOSIS — Y95 Nosocomial condition: Secondary | ICD-10-CM | POA: Diagnosis present

## 2017-02-13 DIAGNOSIS — R1310 Dysphagia, unspecified: Secondary | ICD-10-CM | POA: Diagnosis present

## 2017-02-13 DIAGNOSIS — E871 Hypo-osmolality and hyponatremia: Secondary | ICD-10-CM | POA: Diagnosis present

## 2017-02-13 DIAGNOSIS — I5033 Acute on chronic diastolic (congestive) heart failure: Secondary | ICD-10-CM | POA: Diagnosis present

## 2017-02-13 DIAGNOSIS — E861 Hypovolemia: Secondary | ICD-10-CM | POA: Diagnosis present

## 2017-02-13 DIAGNOSIS — J208 Acute bronchitis due to other specified organisms: Secondary | ICD-10-CM | POA: Diagnosis not present

## 2017-02-13 DIAGNOSIS — J9621 Acute and chronic respiratory failure with hypoxia: Secondary | ICD-10-CM | POA: Diagnosis present

## 2017-02-13 DIAGNOSIS — J209 Acute bronchitis, unspecified: Secondary | ICD-10-CM | POA: Diagnosis present

## 2017-02-13 DIAGNOSIS — J9601 Acute respiratory failure with hypoxia: Secondary | ICD-10-CM | POA: Diagnosis not present

## 2017-02-13 DIAGNOSIS — Z978 Presence of other specified devices: Secondary | ICD-10-CM

## 2017-02-13 DIAGNOSIS — R0602 Shortness of breath: Secondary | ICD-10-CM | POA: Diagnosis present

## 2017-02-13 DIAGNOSIS — R609 Edema, unspecified: Secondary | ICD-10-CM | POA: Diagnosis not present

## 2017-02-13 DIAGNOSIS — Z888 Allergy status to other drugs, medicaments and biological substances status: Secondary | ICD-10-CM

## 2017-02-13 DIAGNOSIS — Z955 Presence of coronary angioplasty implant and graft: Secondary | ICD-10-CM

## 2017-02-13 DIAGNOSIS — I252 Old myocardial infarction: Secondary | ICD-10-CM

## 2017-02-13 DIAGNOSIS — J441 Chronic obstructive pulmonary disease with (acute) exacerbation: Secondary | ICD-10-CM | POA: Diagnosis present

## 2017-02-13 DIAGNOSIS — J9690 Respiratory failure, unspecified, unspecified whether with hypoxia or hypercapnia: Secondary | ICD-10-CM

## 2017-02-13 DIAGNOSIS — J9600 Acute respiratory failure, unspecified whether with hypoxia or hypercapnia: Secondary | ICD-10-CM | POA: Diagnosis not present

## 2017-02-13 LAB — COMPREHENSIVE METABOLIC PANEL
ALT: 28 U/L (ref 17–63)
ANION GAP: 14 (ref 5–15)
AST: 41 U/L (ref 15–41)
Albumin: 4.1 g/dL (ref 3.5–5.0)
Alkaline Phosphatase: 74 U/L (ref 38–126)
BUN: 13 mg/dL (ref 6–20)
CO2: 25 mmol/L (ref 22–32)
Calcium: 9.1 mg/dL (ref 8.9–10.3)
Chloride: 87 mmol/L — ABNORMAL LOW (ref 101–111)
Creatinine, Ser: 0.77 mg/dL (ref 0.61–1.24)
GFR calc non Af Amer: >60 mL/min (ref >60)
Glucose, Bld: 94 mg/dL (ref 65–99)
Potassium: 5 mmol/L (ref 3.5–5.1)
SODIUM: 126 mmol/L — AB (ref 135–145)
TOTAL PROTEIN: 7.6 g/dL (ref 6.5–8.1)
Total Bilirubin: 0.4 mg/dL (ref 0.3–1.2)

## 2017-02-13 LAB — I-STAT CG4 LACTIC ACID, ED: Lactic Acid, Venous: 2.66 mmol/L (ref 0.5–1.9)

## 2017-02-13 LAB — CBC WITH DIFFERENTIAL/PLATELET
BASOS PCT: 0 %
Basophils Absolute: 0 10*3/uL (ref 0.0–0.1)
EOS ABS: 0 10*3/uL (ref 0.0–0.7)
Eosinophils Relative: 0 %
HCT: 40.7 % (ref 39.0–52.0)
HEMOGLOBIN: 14.5 g/dL (ref 13.0–17.0)
LYMPHS ABS: 3.4 10*3/uL (ref 0.7–4.0)
LYMPHS PCT: 21 %
MCH: 33 pg (ref 26.0–34.0)
MCHC: 35.6 g/dL (ref 30.0–36.0)
MCV: 92.7 fL (ref 78.0–100.0)
MONO ABS: 2.1 10*3/uL — AB (ref 0.1–1.0)
Monocytes Relative: 13 %
NEUTROS ABS: 10.5 10*3/uL — AB (ref 1.7–7.7)
Neutrophils Relative %: 66 %
Platelets: 344 10*3/uL (ref 150–400)
RBC: 4.39 MIL/uL (ref 4.22–5.81)
RDW: 13 % (ref 11.5–15.5)
WBC: 16 10*3/uL — ABNORMAL HIGH (ref 4.0–10.5)

## 2017-02-13 LAB — I-STAT ARTERIAL BLOOD GAS, ED
ACID-BASE DEFICIT: 2 mmol/L (ref 0.0–2.0)
ACID-BASE DEFICIT: 1 mmol/L (ref 0.0–2.0)
BICARBONATE: 26.9 mmol/L (ref 20.0–28.0)
Bicarbonate: 28.1 mmol/L — ABNORMAL HIGH (ref 20.0–28.0)
O2 SAT: 97 %
O2 Saturation: 100 %
PH ART: 7.237 — AB (ref 7.350–7.450)
PH ART: 7.235 — AB (ref 7.350–7.450)
Patient temperature: 97.2
Patient temperature: 98.8
TCO2: 29 mmol/L (ref 0–100)
TCO2: 30 mmol/L (ref 0–100)
pCO2 arterial: 62.6 mmHg — ABNORMAL HIGH (ref 32.0–48.0)
pCO2 arterial: 66.5 mmHg (ref 32.0–48.0)
pO2, Arterial: 107 mmHg (ref 83.0–108.0)
pO2, Arterial: 630 mmHg — ABNORMAL HIGH (ref 83.0–108.0)

## 2017-02-13 LAB — I-STAT TROPONIN, ED: TROPONIN I, POC: 0 ng/mL (ref 0.00–0.08)

## 2017-02-13 LAB — TROPONIN I: TROPONIN I: 0.25 ng/mL — AB (ref <0.03)

## 2017-02-13 LAB — D-DIMER, QUANTITATIVE: D-Dimer, Quant: 0.65 ug/mL-FEU — ABNORMAL HIGH (ref 0.00–0.50)

## 2017-02-13 LAB — INFLUENZA PANEL BY PCR (TYPE A & B)
Influenza A By PCR: NEGATIVE
Influenza B By PCR: NEGATIVE

## 2017-02-13 LAB — TRIGLYCERIDES: TRIGLYCERIDES: 60 mg/dL (ref <150)

## 2017-02-13 LAB — LACTIC ACID, PLASMA: Lactic Acid, Venous: 2 mmol/L (ref 0.5–1.9)

## 2017-02-13 LAB — PROCALCITONIN: PROCALCITONIN: 0.1 ng/mL

## 2017-02-13 LAB — BRAIN NATRIURETIC PEPTIDE
B NATRIURETIC PEPTIDE 5: 72.2 pg/mL (ref 0.0–100.0)
B Natriuretic Peptide: 52.4 pg/mL (ref 0.0–100.0)

## 2017-02-13 MED ORDER — FENTANYL CITRATE (PF) 100 MCG/2ML IJ SOLN: 100.0000 ug | INTRAMUSCULAR | Status: DC | PRN

## 2017-02-13 MED ORDER — CHLORHEXIDINE GLUCONATE 0.12% ORAL RINSE (MEDLINE KIT)
15.0000 mL | Freq: Two times a day (BID) | OROMUCOSAL | Status: DC
  Administered 2017-02-14 – 2017-02-23 (×20): 15 mL via OROMUCOSAL

## 2017-02-13 MED ORDER — ASPIRIN 81 MG PO CHEW
81.0000 mg | CHEWABLE_TABLET | Freq: Every day | ORAL | Status: DC
  Administered 2017-02-14 – 2017-02-20 (×8): 81 mg
  Filled 2017-02-13 (×8): qty 1

## 2017-02-13 MED ORDER — PROPOFOL 1000 MG/100ML IV EMUL: 0.0000 ug/kg/min | INTRAVENOUS | Status: DC

## 2017-02-13 MED ORDER — IPRATROPIUM BROMIDE 0.02 % IN SOLN
RESPIRATORY_TRACT | Status: AC
Start: 2017-02-13 — End: 2017-02-13
  Filled 2017-02-13: qty 2.5

## 2017-02-13 MED ORDER — MAGNESIUM SULFATE 2 GM/50ML IV SOLN
2.0000 g | Freq: Once | INTRAVENOUS | Status: AC
  Administered 2017-02-13: 2 g via INTRAVENOUS
  Filled 2017-02-13: qty 50

## 2017-02-13 MED ORDER — LEVOFLOXACIN IN D5W 750 MG/150ML IV SOLN: 750.0000 mg | INTRAVENOUS | Status: DC

## 2017-02-13 MED ORDER — SODIUM CHLORIDE 0.9 % IV SOLN
25.0000 ug/h | INTRAVENOUS | Status: DC
  Filled 2017-02-13: qty 50

## 2017-02-13 MED ORDER — PIPERACILLIN-TAZOBACTAM 3.375 G IVPB 30 MIN
3.3750 g | Freq: Once | INTRAVENOUS | Status: DC
  Filled 2017-02-13: qty 50

## 2017-02-13 MED ORDER — NITROGLYCERIN IN D5W 200-5 MCG/ML-% IV SOLN
0.0000 ug/min | Freq: Once | INTRAVENOUS | Status: AC
  Administered 2017-02-13: 5 ug/min via INTRAVENOUS

## 2017-02-13 MED ORDER — VANCOMYCIN HCL IN DEXTROSE 1-5 GM/200ML-% IV SOLN
1000.0000 mg | Freq: Once | INTRAVENOUS | Status: AC
  Administered 2017-02-13: 1000 mg via INTRAVENOUS

## 2017-02-13 MED ORDER — DEXTROSE 5 % IV SOLN
1.0000 g | Freq: Three times a day (TID) | INTRAVENOUS | Status: DC
  Administered 2017-02-14 – 2017-02-20 (×20): 1 g via INTRAVENOUS
  Filled 2017-02-13 (×22): qty 1

## 2017-02-13 MED ORDER — FENTANYL CITRATE (PF) 100 MCG/2ML IJ SOLN
50.0000 ug | Freq: Once | INTRAMUSCULAR | Status: AC
  Administered 2017-02-13: 50 ug via INTRAVENOUS

## 2017-02-13 MED ORDER — VANCOMYCIN HCL IN DEXTROSE 1-5 GM/200ML-% IV SOLN
1000.0000 mg | Freq: Once | INTRAVENOUS | Status: DC
  Filled 2017-02-13: qty 200

## 2017-02-13 MED ORDER — HEPARIN SODIUM (PORCINE) 5000 UNIT/ML IJ SOLN
5000.0000 [IU] | Freq: Three times a day (TID) | INTRAMUSCULAR | Status: DC
  Administered 2017-02-14 – 2017-02-21 (×22): 5000 [IU] via SUBCUTANEOUS
  Filled 2017-02-13 (×22): qty 1

## 2017-02-13 MED ORDER — FENTANYL BOLUS VIA INFUSION
50.0000 ug | INTRAVENOUS | Status: DC | PRN
  Administered 2017-02-16 – 2017-02-20 (×14): 50 ug via INTRAVENOUS
  Filled 2017-02-13: qty 50

## 2017-02-13 MED ORDER — ATORVASTATIN CALCIUM 20 MG PO TABS
20.0000 mg | ORAL_TABLET | Freq: Every day | ORAL | Status: DC
  Administered 2017-02-15 – 2017-02-20 (×6): 20 mg via ORAL
  Filled 2017-02-13 (×6): qty 1

## 2017-02-13 MED ORDER — ASPIRIN 81 MG PO CHEW: 81.0000 mg | CHEWABLE_TABLET | Freq: Every day | ORAL | Status: DC

## 2017-02-13 MED ORDER — SODIUM CHLORIDE 0.9 % IV SOLN
25.0000 ug/h | INTRAVENOUS | Status: DC
  Administered 2017-02-13: 50 ug/h via INTRAVENOUS
  Administered 2017-02-14 – 2017-02-15 (×5): 400 ug/h via INTRAVENOUS
  Administered 2017-02-15 – 2017-02-16 (×2): 300 ug/h via INTRAVENOUS
  Administered 2017-02-16: 175 ug/h via INTRAVENOUS
  Administered 2017-02-16: 250 ug/h via INTRAVENOUS
  Administered 2017-02-17 – 2017-02-19 (×5): 300 ug/h via INTRAVENOUS
  Administered 2017-02-19: 350 ug/h via INTRAVENOUS
  Administered 2017-02-19 – 2017-02-21 (×7): 400 ug/h via INTRAVENOUS
  Filled 2017-02-13 (×25): qty 50

## 2017-02-13 MED ORDER — SODIUM CHLORIDE 0.9 % IV SOLN
250.0000 mL | INTRAVENOUS | Status: DC | PRN
  Administered 2017-02-14 – 2017-02-19 (×4): 250 mL via INTRAVENOUS

## 2017-02-13 MED ORDER — ALBUTEROL SULFATE (2.5 MG/3ML) 0.083% IN NEBU
INHALATION_SOLUTION | RESPIRATORY_TRACT | Status: AC
  Filled 2017-02-13: qty 6

## 2017-02-13 MED ORDER — FUROSEMIDE 10 MG/ML IJ SOLN
40.0000 mg | Freq: Four times a day (QID) | INTRAMUSCULAR | Status: AC
  Administered 2017-02-14 (×2): 40 mg via INTRAVENOUS
  Filled 2017-02-13 (×2): qty 4

## 2017-02-13 MED ORDER — METHYLPREDNISOLONE SODIUM SUCC 125 MG IJ SOLR
60.0000 mg | Freq: Four times a day (QID) | INTRAMUSCULAR | Status: DC
  Administered 2017-02-14 (×2): 60 mg via INTRAVENOUS
  Filled 2017-02-13 (×2): qty 2

## 2017-02-13 MED ORDER — ORAL CARE MOUTH RINSE
15.0000 mL | Freq: Four times a day (QID) | OROMUCOSAL | Status: DC
  Administered 2017-02-14 – 2017-02-24 (×37): 15 mL via OROMUCOSAL

## 2017-02-13 MED ORDER — SODIUM CHLORIDE 0.9 % IV BOLUS (SEPSIS)
500.0000 mL | Freq: Once | INTRAVENOUS | Status: AC
  Administered 2017-02-13: 500 mL via INTRAVENOUS

## 2017-02-13 MED ORDER — METOPROLOL TARTRATE 25 MG/10 ML ORAL SUSPENSION
25.0000 mg | Freq: Two times a day (BID) | ORAL | Status: DC
  Administered 2017-02-14: 25 mg
  Filled 2017-02-13 (×2): qty 10

## 2017-02-13 MED ORDER — ETOMIDATE 2 MG/ML IV SOLN
INTRAVENOUS | Status: AC | PRN
  Administered 2017-02-13: 20 mg via INTRAVENOUS

## 2017-02-13 MED ORDER — NITROGLYCERIN 0.4 MG SL SUBL
0.4000 mg | SUBLINGUAL_TABLET | SUBLINGUAL | Status: DC | PRN
  Administered 2017-02-13: 0.4 mg via SUBLINGUAL

## 2017-02-13 MED ORDER — FENTANYL BOLUS VIA INFUSION
25.0000 ug | INTRAVENOUS | Status: DC | PRN
Start: 2017-02-13 — End: 2017-02-13
  Filled 2017-02-13: qty 25

## 2017-02-13 MED ORDER — PROPOFOL 1000 MG/100ML IV EMUL
INTRAVENOUS | Status: AC
  Administered 2017-02-13: 5 ug/kg/min via INTRAVENOUS
  Filled 2017-02-13: qty 100

## 2017-02-13 MED ORDER — ROCURONIUM BROMIDE 50 MG/5ML IV SOLN
INTRAVENOUS | Status: AC | PRN
  Administered 2017-02-13: 100 mg via INTRAVENOUS

## 2017-02-13 MED ORDER — VANCOMYCIN HCL IN DEXTROSE 750-5 MG/150ML-% IV SOLN
750.0000 mg | Freq: Two times a day (BID) | INTRAVENOUS | Status: DC
  Administered 2017-02-14 – 2017-02-17 (×7): 750 mg via INTRAVENOUS
  Filled 2017-02-13 (×7): qty 150

## 2017-02-13 MED ORDER — ALBUTEROL SULFATE (2.5 MG/3ML) 0.083% IN NEBU: 2.5000 mg | INHALATION_SOLUTION | RESPIRATORY_TRACT | Status: DC | PRN

## 2017-02-13 MED ORDER — IPRATROPIUM-ALBUTEROL 0.5-2.5 (3) MG/3ML IN SOLN
3.0000 mL | Freq: Four times a day (QID) | RESPIRATORY_TRACT | Status: DC
  Administered 2017-02-13 – 2017-02-18 (×18): 3 mL via RESPIRATORY_TRACT
  Filled 2017-02-13 (×18): qty 3

## 2017-02-13 MED ORDER — PANTOPRAZOLE SODIUM 40 MG IV SOLR
40.0000 mg | Freq: Every day | INTRAVENOUS | Status: DC
  Administered 2017-02-14 – 2017-02-20 (×8): 40 mg via INTRAVENOUS
  Filled 2017-02-13 (×8): qty 40

## 2017-02-13 NOTE — ED Provider Notes (Signed)
[facility] DEPT Provider Note   CSN: [id number] Arrival date & time:        History   Chief Complaint Chief Complaint  Patient presents with  . Shortness of Breath    HPI Randy Kirk is a 71 y.o. male.  The history is provided by the patient and the EMS personnel.  Shortness of Breath  This is a recurrent problem. The average episode lasts 2 days. The problem occurs continuously.The current episode started yesterday. The problem has been rapidly worsening. Associated symptoms include cough. Pertinent negatives include no fever, no sore throat, no ear pain, no chest pain, no vomiting, no abdominal pain and no rash. He has tried beta-agonist inhalers for the symptoms. The treatment provided no relief. Associated medical issues include COPD, CAD and past MI.    Past Medical History:  Diagnosis Date  . CAD (coronary artery disease)   . COPD (chronic obstructive pulmonary disease) ([place])   . Dyslipidemia   . Dyspnea   . Hyperlipidemia   . Hypertension   . Hypotension   . Ischemic heart disease   . Leukocytosis   . Myocardial infarction     Patient Active Problem List   Diagnosis Date Noted  . Respiratory failure ([place])   . Acute respiratory distress 02/21/2017  . COPD exacerbation ([place]) 12/05/2016  . Mild dehydration 12/05/2016  . History of weight loss 12/05/2016  . Hyperlipidemia   . COPD 08/30/2009  . DYSPNEA 08/30/2009  . LEUKOCYTOSIS 07/22/2009  . TOBACCO ABUSE 07/22/2009  . Essential hypertension 07/22/2009  . MYOCARDIAL INFARCTION 07/22/2009  . CAD 07/22/2009  . Chronic ischemic heart disease 07/22/2009  . HYPOTENSION 07/22/2009    Past Surgical History:  Procedure Laterality Date  . LEFT HEART CATH  2001   with selective coronary angiography  . PERCUTANEOUS CORONARY STENT INTERVENTION (PCI-S)  2001   of the mid LAD       Home Medications    Prior to Admission medications   Medication Sig Start Date End Date Taking? Authorizing Provider    aspirin EC 81 MG tablet Take 1 tablet (81 mg total) by mouth daily. 05/18/15  Yes Evans Lance, MD  atorvastatin (LIPITOR) 20 MG tablet Take 1 tablet (20 mg total) by mouth daily. 08/29/16  Yes Evans Lance, MD  budesonide-formoterol [facility]) 160-4.5 MCG/ACT inhaler Inhale 2 puffs into the lungs 2 (two) times daily. 12/09/16  Yes Theodis Blaze, MD  chlorpheniramine (CHLORPHEN) 4 MG tablet Take 4 mg by mouth 2 (two) times daily as needed for allergies.   Yes Historical Provider, MD  Dextromethorphan-Guaifenesin ([place] FAST-MAX DM MAX) 5-100 MG/5ML LIQD Take 20 mLs by mouth every 4 (four) hours as needed (for cough/congestion/phlegm).   Yes Historical Provider, MD  furosemide (LASIX) 20 MG tablet Take 20 mg by mouth daily.   Yes Historical Provider, MD  ibuprofen (ADVIL,MOTRIN) 400 MG tablet Take 1 tablet (400 mg total) by mouth every 6 (six) hours as needed for moderate pain. Patient taking differently: Take 200-400 mg by mouth every 6 (six) hours as needed for moderate pain.  12/09/16  Yes Theodis Blaze, MD  ipratropium-albuterol (DUONEB) 0.5-2.5 (3) MG/3ML SOLN Take 3 mLs by nebulization every 4 (four) hours as needed. Patient taking differently: Take 3 mLs by nebulization every 4 (four) hours as needed (for wheezing or shortness of breath).  12/09/16  Yes Theodis Blaze, MD  metoprolol tartrate (LOPRESSOR) 25 MG tablet TAKE 1 TABLET (25 MG TOTAL) BY MOUTH 2 (TWO)  TIMES DAILY. 09/03/16  Yes Evans Lance, MD  naproxen sodium (ALEVE) 220 MG tablet Take 220-440 mg by mouth 2 (two) times daily as needed (for pain).   Yes Historical Provider, MD  OXYGEN Inhale 3-4 L into the lungs continuous.   Yes Historical Provider, MD  Phenylephrine-Pheniramine-DM [facility] COLD & COUGH PO) Take 1 packet by mouth every 6 (six) hours as needed (for symptoms/discomfort/congestion).   Yes Historical Provider, MD  Potassium 99 MG TABS Take 99 mg by mouth daily.   Yes Historical Provider, MD  PROAIR HFA 108 (90  Base) MCG/ACT inhaler INHALE 1 TO 2 PUFFS EVERY 4 TO 6 HOURS AS NEEDED. Patient taking differently: INHALE 1 TO 2 PUFFS EVERY 4 TO 6 HOURS AS NEEDED FOR SHORTNESS OF BREATH. 09/03/16  Yes Evans Lance, MD  traZODone (DESYREL) 50 MG tablet Take 0.5 tablets (25 mg total) by mouth at bedtime as needed for sleep. 12/09/16  Yes Theodis Blaze, MD  doxycycline (VIBRA-TABS) 100 MG tablet Take 1 tablet (100 mg total) by mouth every 12 (twelve) hours. Patient not taking: Reported on 02/01/2017 12/09/16   Theodis Blaze, MD  guaiFENesin-dextromethorphan [facility] DM) 100-10 MG/5ML syrup Take 5 mLs by mouth every 4 (four) hours as needed for cough. 12/09/16   Theodis Blaze, MD  nicotine (NICODERM CQ - DOSED IN MG/24 HOURS) 14 mg/24hr patch Place 1 patch onto the skin daily. FOR 6 WEEKS 12/18/16   Historical Provider, MD  nitroGLYCERIN (NITROSTAT) 0.4 MG SL tablet Place 1 tablet (0.4 mg total) under the tongue every 5 (five) minutes as needed for chest pain. 05/18/15 01/03/17  Evans Lance, MD  predniSONE (DELTASONE) 10 MG tablet Take 50 mg tablet 12/10/2016 and taper down by 10 mg daily until completed Patient not taking: Reported on 02/06/2017 12/09/16   Theodis Blaze, MD    Family History Family History  Problem Relation Age of Onset  . Family history unknown: Yes    Social History Social History  Substance Use Topics  . Smoking status: Former Smoker    Years: 20.00    Types: Cigarettes    Quit date: 07/27/2012  . Smokeless tobacco: Never Used  . Alcohol use No     Allergies   Penicillins and Prednisone   Review of Systems Review of Systems  Constitutional: Negative for chills and fever.  HENT: Negative for ear pain and sore throat.   Eyes: Negative for pain and visual disturbance.  Respiratory: Positive for cough and shortness of breath.   Cardiovascular: Negative for chest pain and palpitations.  Gastrointestinal: Negative for abdominal pain and vomiting.  Genitourinary: Negative for  dysuria and hematuria.  Musculoskeletal: Negative for arthralgias and back pain.  Skin: Negative for color change and rash.  Neurological: Negative for seizures and syncope.  All other systems reviewed and are negative.    Physical Exam Updated Vital Signs BP 106/61   Pulse 93   Temp 99.3 F (37.4 C) (Oral)   Resp (!) 26   Ht _0  (1.727 m)   Wt 70.2 kg   SpO2 97%   BMI 23.53 kg/m   Physical Exam  Constitutional: He is oriented to person, place, and time. He appears well-developed and well-nourished. He appears distressed.  HENT:  Head: Normocephalic and atraumatic.  Eyes: Conjunctivae are normal.  Neck: Neck supple.  Cardiovascular: Regular rhythm and intact distal pulses.   No murmur heard. Tachycardic  Pulmonary/Chest: He is in respiratory distress. He has wheezes.  Tachypneic, increased WOB, accessory muscle use  Abdominal: Soft. There is no tenderness.  Musculoskeletal: He exhibits edema (2+ pitting edema of b/l LE).  Neurological: He is alert and oriented to person, place, and time. Abnormal reflex:    Skin: Skin is warm and dry.  Psychiatric: He has a normal mood and affect.  Nursing note and vitals reviewed.    ED Treatments / Results  Labs (all labs ordered are listed, but only abnormal results are displayed) Labs Reviewed  CBC WITH DIFFERENTIAL/PLATELET - Abnormal; Notable for the following:       Result Value   WBC 16.0 (*)    Neutro Abs 10.5 (*)    Monocytes Absolute 2.1 (*)    All other components within normal limits  COMPREHENSIVE METABOLIC PANEL - Abnormal; Notable for the following:    Sodium 126 (*)    Chloride 87 (*)    All other components within normal limits  D-DIMER, QUANTITATIVE (NOT AT [facility]) - Abnormal; Notable for the following:    D-Dimer, Quant 0.65 (*)    All other components within normal limits  LACTIC ACID, PLASMA - Abnormal; Notable for the following:    Lactic Acid, Venous 2.0 (*)    All other components within normal  limits  LACTIC ACID, PLASMA - Abnormal; Notable for the following:    Lactic Acid, Venous 2.1 (*)    All other components within normal limits  TROPONIN I - Abnormal; Notable for the following:    Troponin I 0.25 (*)    All other components within normal limits  TROPONIN I - Abnormal; Notable for the following:    Troponin I 0.38 (*)    All other components within normal limits  CBC - Abnormal; Notable for the following:    WBC 10.6 (*)    RBC 3.93 (*)    Hemoglobin 12.6 (*)    HCT 36.7 (*)    All other components within normal limits  URINALYSIS, ROUTINE W REFLEX MICROSCOPIC - Abnormal; Notable for the following:    Ketones, ur 5 (*)    All other components within normal limits  BASIC METABOLIC PANEL - Abnormal; Notable for the following:    Sodium 128 (*)    Chloride 94 (*)    Glucose, Bld 125 (*)    Calcium 7.9 (*)    All other components within normal limits  TROPONIN I - Abnormal; Notable for the following:    Troponin I 0.48 (*)    All other components within normal limits  I-STAT CG4 LACTIC ACID, ED - Abnormal; Notable for the following:    Lactic Acid, Venous 2.66 (*)    All other components within normal limits  I-STAT ARTERIAL BLOOD GAS, ED - Abnormal; Notable for the following:    pH, Arterial 7.237 (*)    pCO2 arterial 62.6 (*)    All other components within normal limits  I-STAT ARTERIAL BLOOD GAS, ED - Abnormal; Notable for the following:    pH, Arterial 7.235 (*)    pCO2 arterial 66.5 (*)    pO2, Arterial 630.0 (*)    Bicarbonate 28.1 (*)    All other components within normal limits  MRSA PCR SCREENING  URINE CULTURE  CULTURE, EXPECTORATED SPUTUM-ASSESSMENT  BRAIN NATRIURETIC PEPTIDE  INFLUENZA PANEL BY PCR (TYPE A & B)  BRAIN NATRIURETIC PEPTIDE  PROCALCITONIN  TRIGLYCERIDES  MAGNESIUM  PHOSPHORUS  PROCALCITONIN  MAGNESIUM  PHOSPHORUS  BLOOD GAS, ARTERIAL  BLOOD GAS, ARTERIAL  MAGNESIUM  PHOSPHORUS  I-STAT  Benjamin Perez, ED    EKG  EKG  Interpretation None       Radiology Dg Chest Port 1 View  Result Date: 02/14/2017 CLINICAL DATA:  Ventilator dependent. EXAM: PORTABLE CHEST 1 VIEW COMPARISON:  02/09/2017 FINDINGS: Endotracheal tube terminates between the clavicles and carina. Enteric tube courses into the left upper abdomen with tip not imaged. The cardiomediastinal silhouette is within normal limits. The lungs remain hyperinflated consistent with COPD. Blunting of the right greater than left costophrenic angles is unchanged. No large pleural effusion is seen. No confluent airspace opacity, pulmonary edema, or pneumothorax is identified. IMPRESSION: 1. Endotracheal tube in satisfactory position. 2. COPD.  No evidence of acute airspace disease. Electronically Signed   By: Logan Bores M.D.   On: 02/14/2017 07:41   Dg Chest Portable 1 View  Result Date: 02/23/2017 CLINICAL DATA:  Endotracheal tube placement.  Initial encounter. EXAM: PORTABLE CHEST 1 VIEW COMPARISON:  Chest radiograph performed earlier today at 5:21 p.m. FINDINGS: The patient's endotracheal tube is seen ending 3-4 cm above the carina. An enteric tube is noted extending off the inferior edge of the image. The lungs are hyperexpanded, with flattening of the hemidiaphragms, compatible with COPD. The lung bases are incompletely imaged on this study. Mild vascular congestion is noted. No definite pleural effusion or pneumothorax is seen. The cardiomediastinal silhouette is normal in size. No acute osseous abnormalities are identified. IMPRESSION: 1. Endotracheal tube seen ending 3-4 cm above the carina. 2. Findings of COPD.  Mild vascular congestion noted. Electronically Signed   By: Garald Balding M.D.   On: 02/16/2017 18:48   Dg Chest Portable 1 View  Result Date: 02/07/2017 CLINICAL DATA:  71 y/o M; shortness of breath, hypertension, COPD, coronary artery disease, ischemic heart disease, dyspnea, myocardial infarction, hypotension. EXAM: PORTABLE CHEST 1 VIEW  COMPARISON:  12/06/2016 chest radiograph FINDINGS: Stable heart size and mediastinal contours are within normal limits. Both lungs are clear. Chronic blunting of costal diaphragmatic angles. Aortic atherosclerosis with calcification. Hyperinflated lungs compatible with COPD. The visualized skeletal structures are unremarkable. IMPRESSION: COPD.  Aortic atherosclerosis.  No acute pulmonary process. Electronically Signed   By: Kristine Garbe M.D.   On: 02/05/2017 17:35    Procedures Procedure Name: Intubation Date/Time: 02/14/2017 12:37 PM Performed by: Clifton James Pre-anesthesia Checklist: Patient identified, Patient being monitored, Timeout performed, Emergency Drugs available and Suction available Oxygen Delivery Method: Ambu bag Preoxygenation: Pre-oxygenation with 100% oxygen (BiPap) Intubation Type: Rapid sequence Ventilation: Mask ventilation without difficulty Laryngoscope Size: Glidescope, Mac and 4 Grade View: Grade I Tube type: Subglottic suction tube Tube size: 8.0 mm Number of attempts: 1 Airway Equipment and Method: Rigid stylet and Video-laryngoscopy Placement Confirmation: ETT inserted through vocal cords under direct vision,  Positive ETCO2,  Breath sounds checked- equal and bilateral and CO2 detector Secured at: 24 cm Tube secured with: ETT holder      (including critical care time)  Medications Ordered in ED Medications  nitroGLYCERIN (NITROSTAT) SL tablet 0.4 mg (0.4 mg Sublingual Given 02/21/2017 1720)  albuterol (PROVENTIL) (2.5 MG/3ML) 0.083% nebulizer solution (not administered)  ipratropium (ATROVENT) 0.02 % nebulizer solution (not administered)  0.9 %  sodium chloride infusion (250 mLs Intravenous Rate/Dose Verify 02/14/17 0700)  heparin injection 5,000 Units (5,000 Units Subcutaneous Given 02/14/17 0530)  pantoprazole (PROTONIX) injection 40 mg (40 mg Intravenous Given 02/14/17 0015)  ipratropium-albuterol (DUONEB) 0.5-2.5 (3) MG/3ML nebulizer  solution 3 mL (3 mLs Nebulization Given 02/14/17 0911)  albuterol (PROVENTIL) (2.5 MG/3ML) 0.083% nebulizer solution  2.5 mg (not administered)  furosemide (LASIX) injection 40 mg (40 mg Intravenous Given 02/14/17 0637)  metoprolol tartrate (LOPRESSOR) 25 mg/10 mL oral suspension 25 mg (25 mg Per Tube Not Given 02/14/17 1000)  ceFEPIme (MAXIPIME) 1 g in dextrose 5 % 50 mL IVPB (1 g Intravenous Given 02/14/17 0530)  vancomycin (VANCOCIN) IVPB 750 mg/150 ml premix (750 mg Intravenous Given 02/14/17 0747)  atorvastatin (LIPITOR) tablet 20 mg (not administered)  fentaNYL (SUBLIMAZE) 2,500 mcg in sodium chloride 0.9 % 250 mL (10 mcg/mL) infusion (400 mcg/hr Intravenous New Bag/Given 02/14/17 0820)  aspirin chewable tablet 81 mg (81 mg Per Tube Given 02/14/17 1131)  fentaNYL (SUBLIMAZE) bolus via infusion 50 mcg (not administered)  chlorhexidine gluconate (MEDLINE KIT) (PERIDEX) 0.12 % solution 15 mL (15 mLs Mouth Rinse Given 02/14/17 0747)  MEDLINE mouth rinse (15 mLs Mouth Rinse Given 02/14/17 1132)  midazolam (VERSED) injection 1-4 mg (2 mg Intravenous Given 02/14/17 0820)  propofol (DIPRIVAN) 1000 MG/100ML infusion (10 mcg/kg/min  68.8 kg Intravenous Rate/Dose Verify 02/14/17 0700)  feeding supplement (VITAL HIGH PROTEIN) liquid 1,000 mL (1,000 mLs Per Tube Given 02/14/17 1130)  feeding supplement (PRO-STAT SUGAR FREE 64) liquid 30 mL (30 mLs Per Tube Given 02/14/17 1130)  methylPREDNISolone sodium succinate (SOLU-MEDROL) 125 mg/2 mL injection 40 mg (not administered)  budesonide (PULMICORT) nebulizer solution 0.5 mg (not administered)  arformoterol (BROVANA) nebulizer solution 15 mcg (not administered)  magnesium sulfate IVPB 2 g 50 mL (0 g Intravenous Stopped 02/16/2017 1832)  nitroGLYCERIN 50 mg in dextrose 5 % 250 mL (0.2 mg/mL) infusion (0 mcg/min Intravenous Stopped 02/04/2017 2145)  etomidate (AMIDATE) injection (20 mg Intravenous Given 01/31/2017 1802)  rocuronium (ZEMURON) injection (100 mg Intravenous Given  02/20/2017 1803)  propofol (DIPRIVAN) 1000 MG/100ML infusion (  Stopped 02/06/2017 2030)  vancomycin (VANCOCIN) IVPB 1000 mg/200 mL premix (1,000 mg Intravenous Given by Other 02/04/2017 1915)  fentaNYL (SUBLIMAZE) injection 50 mcg (50 mcg Intravenous Given 02/11/2017 2057)  sodium chloride 0.9 % bolus 500 mL (500 mLs Intravenous Given 02/20/2017 2243)  midazolam (VERSED) 2 MG/2ML injection (  Duplicate 5/91/63 8466)  haloperidol lactate (HALDOL) injection 5 mg (5 mg Intravenous Given 02/14/17 0052)  sodium chloride 0.9 % bolus 250 mL (250 mLs Intravenous Given 02/14/17 0130)  sodium chloride 0.9 % bolus 1,000 mL (1,000 mLs Intravenous Given 02/14/17 0230)     Initial Impression / Assessment and Plan / ED Course  I have reviewed the triage vital signs and the nursing notes.  Pertinent labs & imaging results that were available during my care of the patient were reviewed by me and considered in my medical decision making (see chart for details).     Pt with hx as above p/w 2 days of progressive SOB. EMS reports pt was in severe resp distress upon their arrival. EMS gave Duoneb, placed on CPAP, Solumedrol, and SQ Epi 0.29m. Pt initially improved and just PTA began tripoding again with increased WOB. He was continued on CPAP.  Upon arrival here, pt with increased WOB, accessory muscle use, and tachypnea. SaO2 is 100%. Pt has chronic LE edema, seems worse over the last couple days. He reports compliance with all of his medications. Denies any chest pain. BP initially 190/80. Lungs with diffuse course wheezing. Pt was transferred to BiPAP, given Mg, and continuous nebs. BP remains high, he was placed on nitro gtt to reduce preload d/t concern of CHF exacerbation. Pt continued to have increased WOB despite treatments. ABG showed ph 7.23, PCO2 62, PO2 107.  Due to ongoing resp distress, we elected to intubated pt for hypercarbic respiratory failure. He was in agreement. 8.0 ETT placed as above without difficulty. EKG  showed sinus tach without acute ischemic changes. Labs notable for increased lactic acid and leukocytosis. Abx given to cover for possible pneumonia. Pt was admitted to critical care.  Final Clinical Impressions(s) / ED Diagnoses   Final diagnoses:  Ventilator dependent ([place])  Respiratory failure [facility])    New Prescriptions Current Discharge Medication List       Clifton James, MD 02/14/17 [place], MD 02/15/17 1046

## 2017-02-13 NOTE — ED Notes (Signed)
CCM in to examine pt-- daughter-in-law states that pt has been around a family member with flu symptoms.

## 2017-02-13 NOTE — Progress Notes (Signed)
    1. restrraints reneweed  2. Flu PCR negative per RN. So will dc droplet/contact   Dr. Kalman ShanMurali Ornella Coderre, M.D., [facility].C.P Pulmonary and Critical Care Medicine Staff Physician [facility] [place] [facility] Pager: [phone], If no answer or between  15:00h - 7:00h: call [phone]  02/17/2017 11:58 PM

## 2017-02-13 NOTE — ED Triage Notes (Signed)
[facility] with resp distress-- pt on bipap, sitting tripod, severe distress -- pt has minimal breath sounds bilaterally-- has 3+ pitting edema bilateral lower legs.   Received SoluMedrol 125mg  IV, Duoneb treatment, and Epi 0.3mg  [facility]

## 2017-02-13 NOTE — H&P (Signed)
PULMONARY / CRITICAL CARE MEDICINE   Name: Randy Kirk MRN: [id number] DOB: 23-Jun-1946    ADMISSION DATE:  02/23/2017 CONSULTATION DATE:  02/21/2017  REFERRING MD: Dr. Charm Barges   CHIEF COMPLAINT:  Dyspnea   HISTORY OF PRESENT ILLNESS:   71 year old male with PMH of CAD, COPD (on home oxygen at baseline 2-3L), HTN, and MI, presents to ED on 2/15 with progressive dyspnea. For the past 2-3 days has been experiencing cough and increased mucous production, reported that daughter has been sick recently with possibly the flu. Upon arrival to ED patient was in severe respiratory distress requiring intubation.PCCM called to consult.   Of note patient was recently admitted from 12/7-12/11 with COPD exacerbation. Treated with steroid taper and doxycycline.   PAST MEDICAL HISTORY :  He  has a past medical history of CAD (coronary artery disease); COPD (chronic obstructive pulmonary disease) (HCC); Dyslipidemia; Dyspnea; Hyperlipidemia; Hypertension; Hypotension; Ischemic heart disease; Leukocytosis; and Myocardial infarction.  PAST SURGICAL HISTORY: He  has a past surgical history that includes Left heart cath (2001) and Percutaneous coronary stent intervention (pci-s) (2001).  Allergies  Allergen Reactions  . Penicillins Swelling  . Prednisone Swelling and Rash    No current facility-administered medications on file prior to encounter.    Current Outpatient Prescriptions on File Prior to Encounter  Medication Sig  . aspirin EC 81 MG tablet Take 1 tablet (81 mg total) by mouth daily.  Marland Kitchen atorvastatin (LIPITOR) 20 MG tablet Take 1 tablet (20 mg total) by mouth daily.  . budesonide-formoterol (SYMBICORT) 160-4.5 MCG/ACT inhaler Inhale 2 puffs into the lungs 2 (two) times daily.  Marland Kitchen doxycycline (VIBRA-TABS) 100 MG tablet Take 1 tablet (100 mg total) by mouth every 12 (twelve) hours. (Patient not taking: Reported on 02/03/2017)  . guaiFENesin-dextromethorphan (ROBITUSSIN DM) 100-10 MG/5ML syrup Take  5 mLs by mouth every 4 (four) hours as needed for cough.  Marland Kitchen ibuprofen (ADVIL,MOTRIN) 400 MG tablet Take 1 tablet (400 mg total) by mouth every 6 (six) hours as needed for moderate pain.  Marland Kitchen ipratropium-albuterol (DUONEB) 0.5-2.5 (3) MG/3ML SOLN Take 3 mLs by nebulization every 4 (four) hours as needed. (Patient taking differently: Take 3 mLs by nebulization every 4 (four) hours as needed (for wheezing or shortness of breath). )  . metoprolol tartrate (LOPRESSOR) 25 MG tablet TAKE 1 TABLET (25 MG TOTAL) BY MOUTH 2 (TWO) TIMES DAILY.  . nitroGLYCERIN (NITROSTAT) 0.4 MG SL tablet Place 1 tablet (0.4 mg total) under the tongue every 5 (five) minutes as needed for chest pain.  . predniSONE (DELTASONE) 10 MG tablet Take 50 mg tablet 12/10/2016 and taper down by 10 mg daily until completed  . PROAIR HFA 108 (90 Base) MCG/ACT inhaler INHALE 1 TO 2 PUFFS EVERY 4 TO 6 HOURS AS NEEDED. (Patient taking differently: INHALE 1 TO 2 PUFFS EVERY 4 TO 6 HOURS AS NEEDED FOR SHORTNESS OF BREATH.)  . traZODone (DESYREL) 50 MG tablet Take 0.5 tablets (25 mg total) by mouth at bedtime as needed for sleep.    FAMILY HISTORY:  His indicated that his mother is deceased. He indicated that his father is deceased.    SOCIAL HISTORY: He  reports that he quit smoking about 4 years ago. His smoking use included Cigarettes. He quit after 20.00 years of use. He has never used smokeless tobacco. He reports that he does not drink alcohol or use drugs.  REVIEW OF SYSTEMS:   Unable to obtain as patient is vented.   SUBJECTIVE:  On nitro and propofol gtt. Intubated.   VITAL SIGNS: BP (!) 180/110 (BP Location: Right Arm)   Pulse (!) 138   Resp 20   Ht 5\' 8"  (1.727 m)   Wt 62.1 kg (137 lb)   SpO2 98%   BMI 20.83 kg/m   HEMODYNAMICS:    VENTILATOR SETTINGS: Vent Mode: PRVC FiO2 (%):  [50 %-100 %] 100 % Set Rate:  [10 bmp-20 bmp] 20 bmp Vt Set:  [550 mL] 550 mL PEEP:  [5 cmH20] 5 cmH20 Plateau Pressure:  [14 cmH20] 14  cmH20  INTAKE / OUTPUT: No intake/output data recorded.  PHYSICAL EXAMINATION: General: Adult male, no distress, lying in bed  Neuro: Sedated, withdrawals to pain, pupils intact HEENT:  ETT in place  Cardiovascular:  Tachy, no MRG, NI S1/S2, +3 edema to BLE Lungs:  Diminished breath sounds, exp wheezes  Abdomen:  Non-distended, active bowel sounds  Musculoskeletal:  No acute Skin:  Warm, dry, intact   LABS:  BMET  Recent Labs Lab 02/23/2017 1725  NA 126*  K 5.0  CL 87*  CO2 25  BUN 13  CREATININE 0.77  GLUCOSE 94    Electrolytes  Recent Labs Lab 02/17/2017 1725  CALCIUM 9.1    CBC  Recent Labs Lab 02/04/2017 1725  WBC 16.0*  HGB 14.5  HCT 40.7  PLT 344    Coag's No results for input(s): APTT, INR in the last 168 hours.  Sepsis Markers  Recent Labs Lab 02/11/2017 1740  LATICACIDVEN 2.66*    ABG  Recent Labs Lab 02/22/2017 1751  PHART 7.237*  PCO2ART 62.6*  PO2ART 107.0    Liver Enzymes  Recent Labs Lab 02/23/2017 1725  AST 41  ALT 28  ALKPHOS 74  BILITOT 0.4  ALBUMIN 4.1    Cardiac Enzymes No results for input(s): TROPONINI, PROBNP in the last 168 hours.  Glucose No results for input(s): GLUCAP in the last 168 hours.  Imaging Dg Chest Portable 1 View  Result Date: 02/14/2017 CLINICAL DATA:  71 y/o M; shortness of breath, hypertension, COPD, coronary artery disease, ischemic heart disease, dyspnea, myocardial infarction, hypotension. EXAM: PORTABLE CHEST 1 VIEW COMPARISON:  12/06/2016 chest radiograph FINDINGS: Stable heart size and mediastinal contours are within normal limits. Both lungs are clear. Chronic blunting of costal diaphragmatic angles. Aortic atherosclerosis with calcification. Hyperinflated lungs compatible with COPD. The visualized skeletal structures are unremarkable. IMPRESSION: COPD.  Aortic atherosclerosis.  No acute pulmonary process. Electronically Signed   By: Mitzi Hansen M.D.   On: 02/05/2017 17:35      STUDIES:  ECHO 2/15 > CXR 2/15 > No acute, stable heart size and contours are WNL, chronic blunting  CULTURES: Urine 2/15 > Sputum 2/15 > Blood 2/15 >   ANTIBIOTICS: Cefepime 2/15 > Vancomycin 2/15 >   SIGNIFICANT EVENTS: 2/15 > Presents to ED with dyspnea   LINES/TUBES: ETT 2/15 >>  DISCUSSION: 71 year old male with PMH of COPD and HTN  ASSESSMENT / PLAN:  PULMONARY A: Acute on Chronic Hypercarbic Respiratory Failure secondary to COPD exacerbation, +/- viral/bacterial infection  H/O COPD P:   Vent Support Wean as tolerated  ABG in one hour Trend CXR Influenza Pending  Albuterol PRN, Duoneb Q6H Solu-medrol   CARDIOVASCULAR A:  HTN  H/O CAD  P:  ECHO pending  Trend BNP  Trend Trop Lasix 40 mg now  Continue home metoprolol 25mg  BID Continue home Lipitor   RENAL A:   Lactic Acidosis  Hyponatremia  P:  Trend BMP Trend Lactic Acid   GASTROINTESTINAL A:   No issues  P:   NPO PPI Start TF 2/16 if not extubated   HEMATOLOGIC A:   No issues  P:  Trend CBC  INFECTIOUS A:   HCAP vs Viral infection  P:   PAN culture  Trend WBC and fever curve Trend Lactic Acid and Procal Vancomycin and Cefepime   ENDOCRINE A:   No issues  P:   Trend glucose on CBC   NEUROLOGIC A:   Encephalopathy secondary to sedation  P:   Wean fentanyl gtt to achieve RASS RASS goal: 0    FAMILY  - Updates: Patient was DNR prior to intubate, patient requested to be intubated. Daughter verified that he is still a no CPR.    - Inter-disciplinary family meet or Palliative Care meeting due by:  2/22    Jovita KussmaulKatalina Eubanks, AG-ACNP [facility]  Pgr: [phone]  PCCM Pgr: [phone]  Attending Note:  71 year old male active smoker who presents to the ED in acute respiratory failure.  Patient was a DNR prior but asked to be intubated when respiratory failure got worse.  Admitted on December for respiratory failure.  On exam,  significantly decreased BS, very quite chest.  I reviewed CXR myself, ETT in good position and severe hyperinflation.  BP is very elevated on propofol and nitro drip.  Will pan culture, cefepime, vanc, f/u ABG and adjust vent, solumedrol, duonebs, albuterol and start beta blockers at home dose.  Fentanyl drip for comfort.  The patient is critically ill with multiple organ systems failure and requires high complexity decision making for assessment and support, frequent evaluation and titration of therapies, application of advanced monitoring technologies and extensive interpretation of multiple databases.   Critical Care Time devoted to patient care services described in this note is  35  Minutes. This time reflects time of care of this signee Dr Koren BoundWesam Nathanel Tallman. This critical care time does not reflect procedure time, or teaching time or supervisory time of PA/NP/Med student/Med Resident etc but could involve care discussion time.  Alyson ReedyWesam G. Holy Battenfield, M.D. [facility] Medicine. Pager: [phone]. After hours pager: [phone].

## 2017-02-13 NOTE — Progress Notes (Signed)
RT NOTE:  Pt transported to 2S11 without event. [facility].

## 2017-02-13 NOTE — ED Notes (Signed)
Report to Leonette Mostharles, RN on 2300 ([facility])

## 2017-02-13 NOTE — Progress Notes (Addendum)
Pharmacy Antibiotic Note  Randy DistanceRonald L Kirk is a 71 y.o. male admitted on 02/02/2017 with pneumonia. Pt presented with significant respiratory distress requiring intubation, and per family has recently had flu-like symptoms. Pharmacy has been consulted for vancomycin and Zosyn dosing.   Plan: -Vancomycin 1g IV x1 -Zosyn 3.375g IV over 30 min x1 -Follow-up renal function for maintenance doses  ADDENDUM: Vancomycin/Zosyn discontinued per CCM, and pt started on levofloxacin per MD. Then levofloxacin discontinued and pharmacy re-consulted for vancomycin and aztreonam. After discussion with CCM, will utilize cefepime instead of aztreonam as listed PCN allergy was not anaphylaxis or hives.  Plan: -Vancomycin 1g IV x1 then 750mg  IV q12h -Cefepime 1g IV q8h -Monitor renal function closely -Follow-up cultures, LOT, vancomycin level as indicated   Weight: 137 lb (62.1 kg)  No data recorded.   Recent Labs Lab 02/05/2017 1725 01/30/2017 1740  WBC 16.0*  --   LATICACIDVEN  --  2.66*    CrCl cannot be calculated (Patient's most recent lab result is older than the maximum 21 days allowed.).    Allergies  Allergen Reactions  . Penicillins Swelling  . Prednisone Swelling and Rash    Antimicrobials this admission: 2/15 Cefepime>> 2/15 Vancomycin >>  Dose adjustments this admission: none  Microbiology results: 2/15 BCx: IP  Thank you for allowing pharmacy to be a part of this patient's care.  Fredonia HighlandMichael Bitonti, PharmD PGY-1 Pharmacy Resident Pager: [phone] 02/23/2017

## 2017-02-13 NOTE — Progress Notes (Signed)
eLink Physician-Brief Progress Note Patient Name: Randy DistanceRonald L Kirk DOB: 03/19/1946 MRN: [id number]   Date of Service  2017/07/01  HPI/Events of Note  Multiple issues: 1. Lactic Acid = 2.66 >> 2.0, 2. Troponin = 0.25. Patient is already on Metoprolol and 3. Hypotension - related to Propofol IV infusion.   eICU Interventions  Will order: 1. Bolus with 0.9 NaCl 500 mL IV over 30 minutes now. 2. Continue to trend Lactic Acid. 3. ASA 81 mg per tube now and Q day. 4. Continue to trend Troponin.      Intervention Category Intermediate Interventions: Hypotension - evaluation and management;Diagnostic test evaluation  Lenell AntuSommer,Steven Eugene 2017/07/01, 10:11 PM

## 2017-02-13 NOTE — Progress Notes (Signed)
CRITICAL VALUE ALERT  Critical value received:  Lactic Acid 2.0  Date of notification:  02/10/2017  Time of notification:  2149  Critical value read back:Yes.    Nurse who received alert:  Dianna Rossettiharles Jenise Iannelli, RN  MD notified (1st page):  eLink MD  Time of first page:  2156  MD notified (2nd page):  Time of second page:  Responding MD:  Dr. Arsenio LoaderSommer, MD  Time MD responded:  2201

## 2017-02-13 NOTE — Code Documentation (Signed)
Dr. Charm BargesButler intubating at present-- 8.0ETT , purple to yellow change on easycap- breath sounds equal bilateral,

## 2017-02-13 NOTE — Progress Notes (Signed)
CRITICAL VALUE ALERT  Critical value received:  Troponin 0.25  Date of notification:  2017-03-07  Time of notification:  2152  Critical value read back:Yes.    Nurse who received alert:  Dianna Rossettiharles Elayne Gruver, RN  MD notified (1st page):  eLink MD  Time of first page:  2156  MD notified (2nd page):  Time of second page:  Responding MD:  Dr. Arsenio LoaderSommer, MD  Time MD responded:  2201

## 2017-02-14 ENCOUNTER — Inpatient Hospital Stay (HOSPITAL_COMMUNITY): Payer: Medicare Other

## 2017-02-14 DIAGNOSIS — J441 Chronic obstructive pulmonary disease with (acute) exacerbation: Secondary | ICD-10-CM

## 2017-02-14 DIAGNOSIS — J9601 Acute respiratory failure with hypoxia: Secondary | ICD-10-CM

## 2017-02-14 DIAGNOSIS — R0603 Acute respiratory distress: Secondary | ICD-10-CM

## 2017-02-14 DIAGNOSIS — I4891 Unspecified atrial fibrillation: Secondary | ICD-10-CM

## 2017-02-14 DIAGNOSIS — J9690 Respiratory failure, unspecified, unspecified whether with hypoxia or hypercapnia: Secondary | ICD-10-CM

## 2017-02-14 LAB — MAGNESIUM
MAGNESIUM: 2.1 mg/dL (ref 1.7–2.4)
Magnesium: 1.9 mg/dL (ref 1.7–2.4)
Magnesium: 1.9 mg/dL (ref 1.7–2.4)

## 2017-02-14 LAB — PROCALCITONIN: Procalcitonin: 0.62 ng/mL

## 2017-02-14 LAB — URINALYSIS, ROUTINE W REFLEX MICROSCOPIC
BILIRUBIN URINE: NEGATIVE
Glucose, UA: NEGATIVE mg/dL
Hgb urine dipstick: NEGATIVE
KETONES UR: 5 mg/dL — AB
Leukocytes, UA: NEGATIVE
Nitrite: NEGATIVE
PROTEIN: NEGATIVE mg/dL
SPECIFIC GRAVITY, URINE: 1.016 (ref 1.005–1.030)
pH: 5 (ref 5.0–8.0)

## 2017-02-14 LAB — BASIC METABOLIC PANEL
Anion gap: 12 (ref 5–15)
BUN: 16 mg/dL (ref 6–20)
CALCIUM: 7.9 mg/dL — AB (ref 8.9–10.3)
CO2: 22 mmol/L (ref 22–32)
CREATININE: 0.77 mg/dL (ref 0.61–1.24)
Chloride: 94 mmol/L — ABNORMAL LOW (ref 101–111)
GFR calc non Af Amer: >60 mL/min (ref >60)
Glucose, Bld: 125 mg/dL — ABNORMAL HIGH (ref 65–99)
Potassium: 4.8 mmol/L (ref 3.5–5.1)
Sodium: 128 mmol/L — ABNORMAL LOW (ref 135–145)

## 2017-02-14 LAB — GLUCOSE, CAPILLARY
GLUCOSE-CAPILLARY: 118 mg/dL — AB (ref 65–99)
Glucose-Capillary: 120 mg/dL — ABNORMAL HIGH (ref 65–99)
Glucose-Capillary: 120 mg/dL — ABNORMAL HIGH (ref 65–99)

## 2017-02-14 LAB — CBC
HCT: 36.7 % — ABNORMAL LOW (ref 39.0–52.0)
HEMOGLOBIN: 12.6 g/dL — AB (ref 13.0–17.0)
MCH: 32.1 pg (ref 26.0–34.0)
MCHC: 34.3 g/dL (ref 30.0–36.0)
MCV: 93.4 fL (ref 78.0–100.0)
Platelets: 225 10*3/uL (ref 150–400)
RBC: 3.93 MIL/uL — AB (ref 4.22–5.81)
RDW: 13.4 % (ref 11.5–15.5)
WBC: 10.6 10*3/uL — ABNORMAL HIGH (ref 4.0–10.5)

## 2017-02-14 LAB — LACTIC ACID, PLASMA: LACTIC ACID, VENOUS: 2.1 mmol/L — AB (ref 0.5–1.9)

## 2017-02-14 LAB — PHOSPHORUS
PHOSPHORUS: 3.4 mg/dL (ref 2.5–4.6)
Phosphorus: 3.9 mg/dL (ref 2.5–4.6)
Phosphorus: 3.8 mg/dL (ref 2.5–4.6)

## 2017-02-14 LAB — ECHOCARDIOGRAM COMPLETE
HEIGHTINCHES: 68 in
Weight: 2476.21 oz

## 2017-02-14 LAB — TROPONIN I
Troponin I: 0.38 ng/mL (ref <0.03)
Troponin I: 0.48 ng/mL (ref <0.03)

## 2017-02-14 LAB — MRSA PCR SCREENING: MRSA by PCR: NEGATIVE

## 2017-02-14 MED ORDER — PRO-STAT SUGAR FREE PO LIQD
30.0000 mL | Freq: Two times a day (BID) | ORAL | Status: DC
  Administered 2017-02-14 – 2017-02-20 (×14): 30 mL
  Filled 2017-02-14 (×14): qty 30

## 2017-02-14 MED ORDER — BUDESONIDE 0.5 MG/2ML IN SUSP
0.5000 mg | Freq: Two times a day (BID) | RESPIRATORY_TRACT | Status: DC
  Administered 2017-02-14 – 2017-02-21 (×14): 0.5 mg via RESPIRATORY_TRACT
  Filled 2017-02-14 (×14): qty 2

## 2017-02-14 MED ORDER — SODIUM CHLORIDE 0.9 % IV BOLUS (SEPSIS)
1000.0000 mL | Freq: Once | INTRAVENOUS | Status: AC
  Administered 2017-02-14: 1000 mL via INTRAVENOUS

## 2017-02-14 MED ORDER — ARFORMOTEROL TARTRATE 15 MCG/2ML IN NEBU
15.0000 ug | INHALATION_SOLUTION | Freq: Two times a day (BID) | RESPIRATORY_TRACT | Status: DC
  Administered 2017-02-14 – 2017-02-18 (×7): 15 ug via RESPIRATORY_TRACT
  Filled 2017-02-14 (×7): qty 2

## 2017-02-14 MED ORDER — PROPOFOL 1000 MG/100ML IV EMUL
5.0000 ug/kg/min | INTRAVENOUS | Status: DC
  Administered 2017-02-14: 15 ug/kg/min via INTRAVENOUS
  Administered 2017-02-14: 10 ug/kg/min via INTRAVENOUS
  Administered 2017-02-15: 40 ug/kg/min via INTRAVENOUS
  Administered 2017-02-15: 15 ug/kg/min via INTRAVENOUS
  Administered 2017-02-15: 40 ug/kg/min via INTRAVENOUS
  Administered 2017-02-16: 35 ug/kg/min via INTRAVENOUS
  Administered 2017-02-16 (×3): 40 ug/kg/min via INTRAVENOUS
  Filled 2017-02-14 (×10): qty 100

## 2017-02-14 MED ORDER — VITAL AF 1.2 CAL PO LIQD
1000.0000 mL | ORAL | Status: DC
  Administered 2017-02-14 – 2017-02-20 (×6): 1000 mL
  Filled 2017-02-14 (×3): qty 1000

## 2017-02-14 MED ORDER — HALOPERIDOL LACTATE 5 MG/ML IJ SOLN
5.0000 mg | INTRAMUSCULAR | Status: AC
  Administered 2017-02-14: 5 mg via INTRAVENOUS
  Filled 2017-02-14: qty 1

## 2017-02-14 MED ORDER — SODIUM CHLORIDE 0.9 % IV BOLUS (SEPSIS): 500.0000 mL | Freq: Once | INTRAVENOUS | Status: DC

## 2017-02-14 MED ORDER — VITAL HIGH PROTEIN PO LIQD
1000.0000 mL | ORAL | Status: DC
  Administered 2017-02-14: 1000 mL

## 2017-02-14 MED ORDER — MIDAZOLAM HCL 2 MG/2ML IJ SOLN
1.0000 mg | INTRAMUSCULAR | Status: DC | PRN
  Administered 2017-02-14: 1 mg via INTRAVENOUS
  Administered 2017-02-14: 2 mg via INTRAVENOUS
  Administered 2017-02-14: 4 mg via INTRAVENOUS
  Administered 2017-02-14 – 2017-02-15 (×4): 2 mg via INTRAVENOUS
  Administered 2017-02-15: 4 mg via INTRAVENOUS
  Administered 2017-02-15: 2 mg via INTRAVENOUS
  Administered 2017-02-15: 4 mg via INTRAVENOUS
  Administered 2017-02-16: 2 mg via INTRAVENOUS
  Administered 2017-02-16 (×3): 4 mg via INTRAVENOUS
  Administered 2017-02-16 (×2): 2 mg via INTRAVENOUS
  Administered 2017-02-16: 4 mg via INTRAVENOUS
  Administered 2017-02-17 (×2): 2 mg via INTRAVENOUS
  Administered 2017-02-17 (×2): 4 mg via INTRAVENOUS
  Administered 2017-02-17 (×3): 2 mg via INTRAVENOUS
  Filled 2017-02-14: qty 4
  Filled 2017-02-14 (×2): qty 2
  Filled 2017-02-14: qty 4
  Filled 2017-02-14: qty 2
  Filled 2017-02-14 (×4): qty 4
  Filled 2017-02-14: qty 2
  Filled 2017-02-14: qty 4
  Filled 2017-02-14: qty 2
  Filled 2017-02-14 (×3): qty 4
  Filled 2017-02-14 (×4): qty 2
  Filled 2017-02-14 (×2): qty 4

## 2017-02-14 MED ORDER — MIDAZOLAM HCL 2 MG/2ML IJ SOLN
INTRAMUSCULAR | Status: AC
  Filled 2017-02-14: qty 2

## 2017-02-14 MED ORDER — METHYLPREDNISOLONE SODIUM SUCC 125 MG IJ SOLR
40.0000 mg | Freq: Two times a day (BID) | INTRAMUSCULAR | Status: DC
  Administered 2017-02-14 – 2017-02-15 (×3): 40 mg via INTRAVENOUS
  Filled 2017-02-14 (×3): qty 2

## 2017-02-14 MED ORDER — MIDAZOLAM HCL 2 MG/2ML IJ SOLN
INTRAMUSCULAR | Status: AC
  Administered 2017-02-14: 4 mg via INTRAVENOUS
  Filled 2017-02-14: qty 2

## 2017-02-14 MED ORDER — SODIUM CHLORIDE 0.9 % IV BOLUS (SEPSIS)
250.0000 mL | Freq: Once | INTRAVENOUS | Status: AC
Start: 2017-02-14 — End: 2017-02-14
  Administered 2017-02-14: 250 mL via INTRAVENOUS

## 2017-02-14 NOTE — Progress Notes (Signed)
  Echocardiogram 2D Echocardiogram has been performed.  Randy Kirk, Randy Kirk 02/14/2017, 2:20 PM

## 2017-02-14 NOTE — Progress Notes (Signed)
CRITICAL VALUE ALERT  Critical value received:  Lactic Acid 2.1  Date of notification:  02/14/17  Time of notification:  12:30 AM  Critical value read back:Yes.    Nurse who received alert:  Marthann Schillerobert Mueller, RN  MD notified (1st page):  eLink MD  Time of first page:  0040  MD notified (2nd page):  Time of second page:  Responding MD:  Dr. Marchelle Gearingamaswamy, MD  Time MD responded:  [phone]

## 2017-02-14 NOTE — Care Management Note (Signed)
Case Management Note  Patient Details  Name: Koleen DistanceRonald L Ridolfi MRN: [id number] Date of Birth: 04/25/1946  Subjective/Objective:   Acute on Chronic Hypoxemic, Severe Acute COPD exacbation, HCAP                 Action/Plan: Discharge Planning: NCM spoke to pt's dtr-in-law, Othelia PullingKim Yuille. States son, Elby ShowersRonald Martorano Jr, [place]. He lives at home alone. [facility]. Will continue to follow for dc needs.   PCP Marjory LiesBURNETT, BRENT A  Expected Discharge Date:                 Expected Discharge Plan:  Home w Home Health Services  In-House Referral:  Clinical Social Work  Discharge planning Services  CM Consult  Post Acute Care Choice:    Choice offered to:     DME Arranged:    DME Agency:     HH Arranged:    HH Agency:     Status of Service:  In process, will continue to follow  If discussed at Long Length of Stay Meetings, dates discussed:    Additional Comments:  Elliot CousinShavis, Jalonda Antigua Ellen, RN 02/14/2017, 11:21 AM

## 2017-02-14 NOTE — Progress Notes (Signed)
eLink Physician-Brief Progress Note Patient Name: Randy Kirk DOB: 08/12/1946 MRN: [id number]   Date of Service  02/14/2017  HPI/Events of Note  1 hypotensive after lasix, versed and diprivan 2. No foley  eICU Interventions  1. Fluid bolus 2. Place foley     Intervention Category Major Interventions: Other:  Randy Kirk 02/14/2017, 2:21 AM

## 2017-02-14 NOTE — Progress Notes (Signed)
16 fr foley placed per MD order d/t acute urinary retention/unstable critical patient. Pt tolerated procedure well. Feliberto GottronBrad Bass, RN present as second person during insertion. Peri care care with soap and water provided prior to insertion and sterile procedure maintained throughout. Will continue to monitor.    Herma ArdMOSELEY, Kelsei Defino F, RN

## 2017-02-14 NOTE — Progress Notes (Signed)
   Intermittent and now sudden agitation - RASS +4   qtc 416msec  Plan - continue diprivan  - haldol 5mg  IV x 1 -versed 1-4mg  Q2h prn   Dr. Kalman ShanMurali Taisha Pennebaker, M.D., [facility] Pulmonary and Critical Care Medicine Staff Physician [facility] Pager: [phone], If no answer or between  15:00h - 7:00h: call [phone]  02/14/2017 12:44 AM

## 2017-02-14 NOTE — Progress Notes (Signed)
Initial Nutrition Assessment  DOCUMENTATION CODES:   Not applicable  INTERVENTION:    D/C Vital High Protein   Initiate Vital AF 1.2 at goal rate of 55 ml/h (1320 ml per day) and Prostat 30 ml BID   TF regimen to provide 1784 kcals, 120 gm protein, 1071ml free water daily  NUTRITION DIAGNOSIS:   Inadequate oral intake related to inability to eat as evidenced by NPO status  GOAL:   Patient will meet greater than or equal to 90% of their needs  MONITOR:   TF tolerance, Vent status, Labs, Weight trends, I & O's  REASON FOR ASSESSMENT:   Consult Enteral/tube feeding initiation and management  ASSESSMENT:   71 yo Male with PMH of CAD, COPD (on home oxygen at baseline 2-3L), HTN, and MI, presents to ED on 2/15 with progressive dyspnea. For the past 2-3 days has been experiencing cough and increased mucous production, reported that daughter has been sick recently with possibly the flu. Upon arrival to ED patient was in severe respiratory distress requiring intubation.  Patient is currently intubated on ventilator support >> OGT in place MV: 13.8 L/min Temp (24hrs), Avg:97.9 F (36.6 C), Min:97.5 F (36.4 C), Max:99.3 F (37.4 C)  Pt currently in ECHO LAB. In respiratory failure due to severe acute COPD exacerbation, HCAP. Vital High Protein ordered per Adult Tube Feeding Protocol. Labs and medications reviewed. CBG 120.  RD unable to complete Nutrition Focused Physical Exam at this time.  Diet Order:  Diet NPO time specified  Skin:  Reviewed, no issues  Last BM:  PTA  Height:   Ht Readings from Last 1 Encounters:  2017/12/01 5\' 8"  (1.727 m)    Weight:   Wt Readings from Last 1 Encounters:  02/14/17 154 lb 12.2 oz (70.2 kg)    Ideal Body Weight:  70 kg  BMI:  Body mass index is 23.53 kg/m.  Estimated Nutritional Needs:   Kcal:  1829  Protein:  110-120 gm  Fluid:  per MD  EDUCATION NEEDS:   No education needs identified at this time  Maureen ChattersKatie  Rashawna Scoles, RD, LDN Pager #: [phone] After-Hours Pager #: [phone]

## 2017-02-14 NOTE — Progress Notes (Signed)
PULMONARY / CRITICAL CARE MEDICINE   Name: Randy Kirk MRN: [id number] DOB: 03/07/1946    ADMISSION DATE:  02/21/2017 CONSULTATION DATE:  01/31/2017  REFERRING MD: Dr. Charm BargesButler   CHIEF COMPLAINT:  Dyspnea   HISTORY OF PRESENT ILLNESS:   71 year old male with PMH of CAD, COPD (on home oxygen at baseline 2-3L), HTN, and MI, presents to ED on 2/15 with progressive dyspnea. For the past 2-3 days has been experiencing cough and increased mucous production, reported that daughter has been sick recently with possibly the flu. Upon arrival to ED patient was in severe respiratory distress requiring intubation.PCCM called to consult.   Of note patient was recently admitted from 12/7-12/11 with COPD exacerbation. Treated with steroid taper and doxycycline. Noted to have swelling of BLE since 11/2016.  Per daughter in law, pt has been sick the last 2 weeks, getting worse 2-3 days ago.    SUBJECTIVE:  Pt received lasix on 2/15. Had agitation issues overngiht > got haldol and Versed pushes > became hypotensive and had IVF bolus.  Comfortable now.    VITAL SIGNS: BP 106/61   Pulse 93   Temp 97.6 F (36.4 C) (Axillary)   Resp (!) 26   Ht 5\' 8"  (1.727 m)   Wt 70.2 kg (154 lb 12.2 oz)   SpO2 97%   BMI 23.53 kg/m   HEMODYNAMICS:    VENTILATOR SETTINGS: Vent Mode: PRVC FiO2 (%):  [40 %-100 %] 40 % Set Rate:  [10 bmp-26 bmp] 26 bmp Vt Set:  [550 mL] 550 mL PEEP:  [5 cmH20] 5 cmH20 Plateau Pressure:  [14 cmH20-20 cmH20] 19 cmH20  INTAKE / OUTPUT: I/O last 3 completed shifts: In: 2515 [I.V.:615; IV Piggyback:1900] Out: 1285 [Urine:1185; Emesis/NG output:100]  PHYSICAL EXAMINATION: General: Adult male, no distress, lying in bed  Neuro: Sedated, CN groslly intact. (-) lateralizing signs HEENT:  ETT in place PERLA. (-) NVD Cardiovascular:  Tachy, no MRG, NI S1/S2, +2 edema to BLE, R > L Lungs:  Diminished breath sounds, exp wheezes in BULF. (-) rhonchi Abdomen:  Non-distended, active  bowel sounds  Musculoskeletal:  No acute Skin:  Warm, dry, intact . Gr 2 edema R > L  LABS:  BMET  Recent Labs Lab 02/25/2017 1725 02/14/17 0710  NA 126* 128*  K 5.0 4.8  CL 87* 94*  CO2 25 22  BUN 13 16  CREATININE 0.77 0.77  GLUCOSE 94 125*    Electrolytes  Recent Labs Lab 02/19/2017 1725 02/14/17 0710  CALCIUM 9.1 7.9*  MG  --  1.9  PHOS  --  3.9    CBC  Recent Labs Lab 02/12/2017 1725 02/14/17 0604  WBC 16.0* 10.6*  HGB 14.5 12.6*  HCT 40.7 36.7*  PLT 344 225    Coag's No results for input(s): APTT, INR in the last 168 hours.  Sepsis Markers  Recent Labs Lab 02/12/2017 1740 02/20/2017 2102 01/31/2017 2104 02/06/2017 2329 02/14/17 0710  LATICACIDVEN 2.66*  --  2.0* 2.1*  --   PROCALCITON  --  0.10  --   --  0.62    ABG  Recent Labs Lab 02/11/2017 1751 02/26/2017 1935  PHART 7.237* 7.235*  PCO2ART 62.6* 66.5*  PO2ART 107.0 630.0*    Liver Enzymes  Recent Labs Lab 02/06/2017 1725  AST 41  ALT 28  ALKPHOS 74  BILITOT 0.4  ALBUMIN 4.1    Cardiac Enzymes  Recent Labs Lab 02/11/2017 2102 02/21/2017 2329 02/14/17 0710  TROPONINI 0.25* 0.38* 0.48*  Glucose No results for input(s): GLUCAP in the last 168 hours.  Imaging Dg Chest Port 1 View  Result Date: 02/14/2017 CLINICAL DATA:  Ventilator dependent. EXAM: PORTABLE CHEST 1 VIEW COMPARISON:  02/15/2017 FINDINGS: Endotracheal tube terminates between the clavicles and carina. Enteric tube courses into the left upper abdomen with tip not imaged. The cardiomediastinal silhouette is within normal limits. The lungs remain hyperinflated consistent with COPD. Blunting of the right greater than left costophrenic angles is unchanged. No large pleural effusion is seen. No confluent airspace opacity, pulmonary edema, or pneumothorax is identified. IMPRESSION: 1. Endotracheal tube in satisfactory position. 2. COPD.  No evidence of acute airspace disease. Electronically Signed   By: Sebastian Ache M.D.   On:  02/14/2017 07:41   Dg Chest Portable 1 View  Result Date: 02/18/2017 CLINICAL DATA:  Endotracheal tube placement.  Initial encounter. EXAM: PORTABLE CHEST 1 VIEW COMPARISON:  Chest radiograph performed earlier today at 5:21 p.m. FINDINGS: The patient's endotracheal tube is seen ending 3-4 cm above the carina. An enteric tube is noted extending off the inferior edge of the image. The lungs are hyperexpanded, with flattening of the hemidiaphragms, compatible with COPD. The lung bases are incompletely imaged on this study. Mild vascular congestion is noted. No definite pleural effusion or pneumothorax is seen. The cardiomediastinal silhouette is normal in size. No acute osseous abnormalities are identified. IMPRESSION: 1. Endotracheal tube seen ending 3-4 cm above the carina. 2. Findings of COPD.  Mild vascular congestion noted. Electronically Signed   By: Roanna Raider M.D.   On: 02/08/2017 18:48   Dg Chest Portable 1 View  Result Date: 02/14/2017 CLINICAL DATA:  71 y/o M; shortness of breath, hypertension, COPD, coronary artery disease, ischemic heart disease, dyspnea, myocardial infarction, hypotension. EXAM: PORTABLE CHEST 1 VIEW COMPARISON:  12/06/2016 chest radiograph FINDINGS: Stable heart size and mediastinal contours are within normal limits. Both lungs are clear. Chronic blunting of costal diaphragmatic angles. Aortic atherosclerosis with calcification. Hyperinflated lungs compatible with COPD. The visualized skeletal structures are unremarkable. IMPRESSION: COPD.  Aortic atherosclerosis.  No acute pulmonary process. Electronically Signed   By: Mitzi Hansen M.D.   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 Not ready for weaning. Pt has been sick x 2 weeks, worse x 3 days and he is allergic to prednisone.   Plan to rest over the weekend. Attempt PST next week.  Pt was a full DNR prior to intubation.  He requested to be intubated.  Need to discuss code status prior to extubation. I mentioned to family this issue which needs to be addressed next week.  Cont abx > deescalate once with cultures.  Pt is allergic to prednisone. He developed swelling. Will cut down medrol IV from 60 q6 to 40 q12.  I think he is not allergic to medrol.  Start brovana and pulmicort BID. Cont duoneb qid.    CARDIOVASCULAR A:  HTN  H/O CAD  Demand ischemia Concern for CHF given BLE swelling since 11/2016 P:  ECHO pending  S/P Lasix on 2/15.  Holding off on diuresis on 2/16 as BP is soft with sedation. May need more diuresis.  Continue home metoprolol 25mg  BID Continue home Lipitor    RENAL A:   HypoNa, hypoCl, probaly hypovolemic Lactic Acidosis, improved  P:   Start TF IVF to [facility]   GASTROINTESTINAL A:   No issues  P:   NPO PPI Start TF    HEMATOLOGIC A:   No issues  P:  Trend CBC   INFECTIOUS A:   HCAP vs Viral infection  P:   PAN culture  Trend WBC and fever curve Trend Lactic Acid and Procal Vancomycin and Cefepime > deescalate once with cultures.    ENDOCRINE A:   No issues  P:   Trend glucose on CBC    NEUROLOGIC A:   Encephalopathy secondary to sedation  P:   Comfortable on propofol and fentanyl drips and prn versed.  RASS goal: 0    FAMILY  - Updates: Patient was DNR prior to intubate, patient requested to be intubated. Daughter in law updated at bedside on 2/16.   -  Inter-disciplinary family meet or Palliative Care meeting due by:  2/22   I spent  30  minutes of Critical Care time with this patient today.   Pollie Meyer, MD 02/14/2017, 10:32 AM [facility] Pager [phone] After 3 pm or if no answer, call [phone]

## 2017-02-14 NOTE — Progress Notes (Signed)
Pt became very agitated while attempted mouth care, pt combative and attempting to get out of bed, pt currently restrained. eLink button pressed, eLink MD gave orders for 4mg  versed push and 5 mg haldol. Enacted orders, will continue to closely monitor.  Herma ArdMOSELEY, Jahfari Ambers F, RN

## 2017-02-15 ENCOUNTER — Inpatient Hospital Stay (HOSPITAL_COMMUNITY): Payer: Medicare Other

## 2017-02-15 DIAGNOSIS — J9621 Acute and chronic respiratory failure with hypoxia: Secondary | ICD-10-CM

## 2017-02-15 DIAGNOSIS — J9622 Acute and chronic respiratory failure with hypercapnia: Secondary | ICD-10-CM

## 2017-02-15 LAB — PHOSPHORUS
PHOSPHORUS: 3.7 mg/dL (ref 2.5–4.6)
Phosphorus: 5.2 mg/dL — ABNORMAL HIGH (ref 2.5–4.6)

## 2017-02-15 LAB — BASIC METABOLIC PANEL
Anion gap: 11 (ref 5–15)
BUN: 37 mg/dL — AB (ref 6–20)
CO2: 23 mmol/L (ref 22–32)
Calcium: 8.3 mg/dL — ABNORMAL LOW (ref 8.9–10.3)
Chloride: 94 mmol/L — ABNORMAL LOW (ref 101–111)
Creatinine, Ser: 1.07 mg/dL (ref 0.61–1.24)
GFR calc Af Amer: >60 mL/min (ref >60)
GLUCOSE: 103 mg/dL — AB (ref 65–99)
POTASSIUM: 5 mmol/L (ref 3.5–5.1)
Sodium: 128 mmol/L — ABNORMAL LOW (ref 135–145)

## 2017-02-15 LAB — BLOOD GAS, ARTERIAL
ACID-BASE EXCESS: 0.3 mmol/L (ref 0.0–2.0)
Bicarbonate: 24.9 mmol/L (ref 20.0–28.0)
DRAWN BY: 225631
FIO2: 40
MECHVT: 0.55 mL
O2 SAT: 98.1 %
PCO2 ART: 42.7 mmHg (ref 32.0–48.0)
PEEP/CPAP: 5 cmH2O
PH ART: 7.38 (ref 7.350–7.450)
PO2 ART: 109 mmHg — AB (ref 83.0–108.0)
Patient temperature: 97.6
RATE: 26 resp/min

## 2017-02-15 LAB — GLUCOSE, CAPILLARY
Glucose-Capillary: 102 mg/dL — ABNORMAL HIGH (ref 65–99)
Glucose-Capillary: 129 mg/dL — ABNORMAL HIGH (ref 65–99)
Glucose-Capillary: 134 mg/dL — ABNORMAL HIGH (ref 65–99)
Glucose-Capillary: 160 mg/dL — ABNORMAL HIGH (ref 65–99)

## 2017-02-15 LAB — MAGNESIUM
MAGNESIUM: 2.6 mg/dL — AB (ref 1.7–2.4)
Magnesium: 2.4 mg/dL (ref 1.7–2.4)

## 2017-02-15 LAB — CBC
HCT: 35.3 % — ABNORMAL LOW (ref 39.0–52.0)
HEMOGLOBIN: 12 g/dL — AB (ref 13.0–17.0)
MCH: 31.8 pg (ref 26.0–34.0)
MCHC: 34 g/dL (ref 30.0–36.0)
MCV: 93.6 fL (ref 78.0–100.0)
PLATELETS: 275 10*3/uL (ref 150–400)
RBC: 3.77 MIL/uL — AB (ref 4.22–5.81)
RDW: 13.4 % (ref 11.5–15.5)
WBC: 16.3 10*3/uL — ABNORMAL HIGH (ref 4.0–10.5)

## 2017-02-15 LAB — URINE CULTURE
Culture: NO GROWTH
Special Requests: NORMAL

## 2017-02-15 LAB — PROCALCITONIN: PROCALCITONIN: 0.79 ng/mL

## 2017-02-15 MED ORDER — SENNOSIDES 8.8 MG/5ML PO SYRP
5.0000 mL | ORAL_SOLUTION | Freq: Two times a day (BID) | ORAL | Status: DC
  Administered 2017-02-16 – 2017-02-20 (×11): 5 mL
  Filled 2017-02-15 (×11): qty 5

## 2017-02-15 MED ORDER — FUROSEMIDE 10 MG/ML IJ SOLN
40.0000 mg | Freq: Once | INTRAMUSCULAR | Status: AC
  Administered 2017-02-15: 40 mg via INTRAVENOUS
  Filled 2017-02-15: qty 4

## 2017-02-15 MED ORDER — METOPROLOL TARTRATE 25 MG/10 ML ORAL SUSPENSION
12.5000 mg | Freq: Two times a day (BID) | ORAL | Status: DC
  Administered 2017-02-16 – 2017-02-20 (×9): 12.5 mg
  Filled 2017-02-15 (×10): qty 5

## 2017-02-15 NOTE — Progress Notes (Signed)
PULMONARY / CRITICAL CARE MEDICINE   Name: Randy Kirk MRN: [id number] DOB: 13-Mar-1946    ADMISSION DATE:  2017-03-09 CONSULTATION DATE:  03/09/2017  REFERRING MD: Dr. Charm Barges   CHIEF COMPLAINT:  Dyspnea   HISTORY OF PRESENT ILLNESS:   71 year old male with PMH of CAD, COPD (on home oxygen at baseline 2-3L), HTN, and MI, presents to ED on 2/15 with progressive dyspnea. For the past 2-3 days has been experiencing cough and increased mucous production, reported that daughter has been sick recently with possibly the flu. Upon arrival to ED patient was in severe respiratory distress requiring intubation.PCCM called to consult.   Of note patient was recently admitted from 12/7-12/11 with COPD exacerbation. Treated with steroid taper and doxycycline. Noted to have swelling of BLE since 11/2016.  Per daughter in law, pt has been sick the last 2 weeks, getting worse 2-3 days ago.    SUBJECTIVE:  Increased agitation this am,    VITAL SIGNS: BP (!) 89/55   Pulse (!) 114   Temp 97.7 F (36.5 C) (Axillary)   Resp (!) 30   Ht 5\' 8"  (1.727 m)   Wt 167 lb 5.3 oz (75.9 kg)   SpO2 94%   BMI 25.44 kg/m   HEMODYNAMICS:    VENTILATOR SETTINGS: Vent Mode: PRVC FiO2 (%):  [40 %] 40 % Set Rate:  [26 bmp] 26 bmp Vt Set:  [550 mL] 550 mL PEEP:  [5 cmH20] 5 cmH20 Plateau Pressure:  [17 cmH20-26 cmH20] 22 cmH20  INTAKE / OUTPUT: I/O last 3 completed shifts: In: 5457 [I.V.:2034.2; NG/GT:1122.8; IV Piggyback:2300] Out: 2730 [Urine:2630; Emesis/NG output:100]  PHYSICAL EXAMINATION: General: Adult male, no distress, lying in bed  Neuro: Sedated, CN groslly intact. (-) lateralizing signs HEENT:  ETT in place PERLA. (-) NVD Cardiovascular:  Tachy, no MRG, NI S1/S2, +2 edema to BLE, R > L Lungs:  Diminished breath sounds, exp wheezes in BULF. (-) rhonchi Abdomen:  Non-distended, active bowel sounds  Musculoskeletal:  No acute Skin:  Warm, dry, intact . Gr 2 edema R >  L  LABS:  BMET  Recent Labs Lab 03/09/2017 1725 02/14/17 0710 02/15/17 0504  NA 126* 128* 128*  K 5.0 4.8 5.0  CL 87* 94* 94*  CO2 25 22 23   BUN 13 16 37*  CREATININE 0.77 0.77 1.07  GLUCOSE 94 125* 103*    Electrolytes  Recent Labs Lab 03-09-2017 1725  02/14/17 0710 02/14/17 1052 02/14/17 1626 02/15/17 0504  CALCIUM 9.1  --  7.9*  --   --  8.3*  MG  --   < > 1.9 1.9 2.1 2.4  PHOS  --   < > 3.9 3.4 3.8 5.2*  < > = values in this interval not displayed.  CBC  Recent Labs Lab 09-Mar-2017 1725 02/14/17 0604 02/15/17 0504  WBC 16.0* 10.6* 16.3*  HGB 14.5 12.6* 12.0*  HCT 40.7 36.7* 35.3*  PLT 344 225 275    Coag's No results for input(s): APTT, INR in the last 168 hours.  Sepsis Markers  Recent Labs Lab 2017-03-09 1740 Mar 09, 2017 2102 09-Mar-2017 2104 Mar 09, 2017 2329 02/14/17 0710 02/15/17 0504  LATICACIDVEN 2.66*  --  2.0* 2.1*  --   --   PROCALCITON  --  0.10  --   --  0.62 0.79    ABG  Recent Labs Lab 03/09/2017 1751 03/09/17 1935 02/15/17 0355  PHART 7.237* 7.235* 7.380  PCO2ART 62.6* 66.5* 42.7  PO2ART 107.0 630.0* 109*  Liver Enzymes  Recent Labs Lab 2017/02/17 1725  AST 41  ALT 28  ALKPHOS 74  BILITOT 0.4  ALBUMIN 4.1    Cardiac Enzymes  Recent Labs Lab 2017/02/17 2102 02/17/17 2329 02/14/17 0710  TROPONINI 0.25* 0.38* 0.48*    Glucose  Recent Labs Lab 02/14/17 1235 02/14/17 1641 02/14/17 2027 02/14/17 2342 02/15/17 0414  GLUCAP 120* 120* 118* 102* 134*    Imaging Dg Chest Port 1 View  Result Date: 02/15/2017 CLINICAL DATA:  Hypoxia EXAM: PORTABLE CHEST 1 VIEW COMPARISON:  February 14, 2017 FINDINGS: Endotracheal tube tip is 5.2 cm above the carina. Nasogastric tube tip inside port below the diaphragm. The side port is immediately inferior to the gastroesophageal junction. No pneumothorax. Lungs are somewhat hyperexpanded. There is a probable small right pleural effusion with mild right base atelectasis. The blunting of  the right costophrenic angle alternatively could be due to a degree of scarring. No edema or consolidation. Heart size and pulmonary vascularity are normal. No adenopathy. There is atherosclerotic calcification in the aorta. No bone lesions. IMPRESSION: Tube positions as described without pneumothorax. Lungs hyperexpanded. Probable small right pleural effusion with mild right base atelectasis. No lung edema or consolidation. Aortic atherosclerosis. Stable cardiac silhouette. Electronically Signed   By: Bretta Bang III M.D.   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  Cont abx > deescalate once with cultures.  Cont Medrol-look at wean tomorrow  Start brovana and pulmicort BID. Cont duoneb qid.    CARDIOVASCULAR A:  HTN  H/O CAD  Demand ischemia Concern for CHF given BLE swelling since 11/2016 GR 2 DD on Echo w/preserved EF  B/p on low end of normal  P:  I/o goal even/neg bal  Lasix 40mg  IV x 1 . Decrease Metoprolol 12.5mg  Twice daily   Continue home Lipitor    RENAL A:   HypoNa, hypoCl, probaly hypovolemic Lactic Acidosis, improved >2.6>2.1  P:   IVF to [facility]   GASTROINTESTINAL A:   No issues  P:   NPO PPI TF    HEMATOLOGIC A:   No issues  P:  Trend CBC Hep sq DVT prophylaxis   INFECTIOUS A:   HCAP vs  Viral infection >CXR w/ no definite infiltrate  PCT min elvated. LA tr down . P:   Follow cx data   Trend WBC and fever curve  Vancomycin and Cefepime > deescalate once with cultures.    ENDOCRINE A:   No issues  P:   Trend glucose on CBC    NEUROLOGIC A:   Encephalopathy secondary to sedation  Agitation  P:   Cont propofol and fentanyl drips and prn versed.  RASS goal: 0    FAMILY  - Updates: Patient was DNR prior to intubate, patient requested to be intubated.  Daughter in law updated at bedside on 2/16. And 2/17   - Inter-disciplinary family meet or Palliative Care meeting due by:  2/22  Elizabth Palka NP-C  [facility]  [phone]    02/15/2017, 10:01 AM

## 2017-02-15 NOTE — Progress Notes (Signed)
Sedation turned down and pt became very agitated shaking vigorously.  HR increased into mid 130s and desaturated into 70s.  100% FIO2 given for 4 minutes until sedation given back and pt settled down.  RN at bedside. Sats increased back to 94%. RT will continue to monitor.

## 2017-02-15 NOTE — Progress Notes (Signed)
Freida BusmanAllen RN notified that the PICC will not be placed until 02-16-17

## 2017-02-15 NOTE — Plan of Care (Signed)
Problem: Safety: Goal: Ability to remain free from injury will improve Outcome: Not Progressing Pt frequently agitated, requiring restraints and frequent sedation pushes.

## 2017-02-16 ENCOUNTER — Inpatient Hospital Stay (HOSPITAL_COMMUNITY): Payer: Medicare Other

## 2017-02-16 DIAGNOSIS — G9340 Encephalopathy, unspecified: Secondary | ICD-10-CM

## 2017-02-16 DIAGNOSIS — I5033 Acute on chronic diastolic (congestive) heart failure: Secondary | ICD-10-CM

## 2017-02-16 DIAGNOSIS — R609 Edema, unspecified: Secondary | ICD-10-CM

## 2017-02-16 DIAGNOSIS — J208 Acute bronchitis due to other specified organisms: Secondary | ICD-10-CM

## 2017-02-16 LAB — PROCALCITONIN: PROCALCITONIN: 0.46 ng/mL

## 2017-02-16 LAB — BASIC METABOLIC PANEL
Anion gap: 7 (ref 5–15)
BUN: 37 mg/dL — AB (ref 6–20)
CALCIUM: 8.3 mg/dL — AB (ref 8.9–10.3)
CO2: 26 mmol/L (ref 22–32)
CREATININE: 0.85 mg/dL (ref 0.61–1.24)
Chloride: 99 mmol/L — ABNORMAL LOW (ref 101–111)
GFR calc Af Amer: >60 mL/min (ref >60)
GLUCOSE: 181 mg/dL — AB (ref 65–99)
Potassium: 4.5 mmol/L (ref 3.5–5.1)
SODIUM: 132 mmol/L — AB (ref 135–145)

## 2017-02-16 LAB — TRIGLYCERIDES: Triglycerides: 98 mg/dL (ref <150)

## 2017-02-16 LAB — RESPIRATORY PANEL BY PCR
ADENOVIRUS-RVPPCR: NOT DETECTED
Bordetella pertussis: NOT DETECTED
CORONAVIRUS 229E-RVPPCR: NOT DETECTED
CORONAVIRUS HKU1-RVPPCR: NOT DETECTED
Chlamydophila pneumoniae: NOT DETECTED
Coronavirus NL63: NOT DETECTED
Coronavirus OC43: DETECTED — AB
INFLUENZA A-RVPPCR: NOT DETECTED
Influenza B: NOT DETECTED
Metapneumovirus: NOT DETECTED
Mycoplasma pneumoniae: NOT DETECTED
PARAINFLUENZA VIRUS 1-RVPPCR: NOT DETECTED
PARAINFLUENZA VIRUS 2-RVPPCR: NOT DETECTED
Parainfluenza Virus 3: NOT DETECTED
Parainfluenza Virus 4: NOT DETECTED
RESPIRATORY SYNCYTIAL VIRUS-RVPPCR: NOT DETECTED
RHINOVIRUS / ENTEROVIRUS - RVPPCR: NOT DETECTED

## 2017-02-16 LAB — GLUCOSE, CAPILLARY
GLUCOSE-CAPILLARY: 135 mg/dL — AB (ref 65–99)
GLUCOSE-CAPILLARY: 107 mg/dL — AB (ref 65–99)
GLUCOSE-CAPILLARY: 95 mg/dL (ref 65–99)
Glucose-Capillary: 129 mg/dL — ABNORMAL HIGH (ref 65–99)
Glucose-Capillary: 99 mg/dL (ref 65–99)

## 2017-02-16 LAB — VANCOMYCIN, TROUGH: Vancomycin Tr: 18 ug/mL (ref 15–20)

## 2017-02-16 LAB — CBC
HCT: 34.6 % — ABNORMAL LOW (ref 39.0–52.0)
Hemoglobin: 11.7 g/dL — ABNORMAL LOW (ref 13.0–17.0)
MCH: 31.8 pg (ref 26.0–34.0)
MCHC: 33.8 g/dL (ref 30.0–36.0)
MCV: 94 fL (ref 78.0–100.0)
PLATELETS: 245 10*3/uL (ref 150–400)
RBC: 3.68 MIL/uL — ABNORMAL LOW (ref 4.22–5.81)
RDW: 13.5 % (ref 11.5–15.5)
WBC: 14.2 10*3/uL — AB (ref 4.0–10.5)

## 2017-02-16 MED ORDER — CHLORHEXIDINE GLUCONATE CLOTH 2 % EX PADS
6.0000 | MEDICATED_PAD | Freq: Every day | CUTANEOUS | Status: DC
  Administered 2017-02-17 – 2017-02-22 (×6): 6 via TOPICAL

## 2017-02-16 MED ORDER — METHYLPREDNISOLONE SODIUM SUCC 125 MG IJ SOLR
40.0000 mg | Freq: Every day | INTRAMUSCULAR | Status: DC
  Administered 2017-02-17 – 2017-02-20 (×4): 40 mg via INTRAVENOUS
  Filled 2017-02-16 (×4): qty 2

## 2017-02-16 MED ORDER — SODIUM CHLORIDE 0.9% FLUSH
10.0000 mL | Freq: Two times a day (BID) | INTRAVENOUS | Status: DC
  Administered 2017-02-16 – 2017-02-20 (×5): 10 mL

## 2017-02-16 MED ORDER — FUROSEMIDE 10 MG/ML IJ SOLN
40.0000 mg | Freq: Once | INTRAMUSCULAR | Status: AC
  Administered 2017-02-16: 40 mg via INTRAVENOUS
  Filled 2017-02-16: qty 4

## 2017-02-16 MED ORDER — SODIUM CHLORIDE 0.9% FLUSH
10.0000 mL | INTRAVENOUS | Status: DC | PRN
  Administered 2017-02-20: 10 mL
  Filled 2017-02-16: qty 40

## 2017-02-16 MED ORDER — DEXMEDETOMIDINE HCL IN NACL 400 MCG/100ML IV SOLN
0.4000 ug/kg/h | INTRAVENOUS | Status: DC
  Administered 2017-02-16: 0.4 ug/kg/h via INTRAVENOUS
  Administered 2017-02-16 – 2017-02-17 (×4): 1.2 ug/kg/h via INTRAVENOUS
  Administered 2017-02-17: 1 ug/kg/h via INTRAVENOUS
  Administered 2017-02-17 – 2017-02-18 (×9): 1.2 ug/kg/h via INTRAVENOUS
  Administered 2017-02-18: 0.6 ug/kg/h via INTRAVENOUS
  Administered 2017-02-19 – 2017-02-20 (×6): 1.2 ug/kg/h via INTRAVENOUS
  Administered 2017-02-20: 0.7 ug/kg/h via INTRAVENOUS
  Administered 2017-02-20 – 2017-02-21 (×5): 1.2 ug/kg/h via INTRAVENOUS
  Filled 2017-02-16: qty 50
  Filled 2017-02-16 (×2): qty 100
  Filled 2017-02-16 (×2): qty 50
  Filled 2017-02-16 (×5): qty 100
  Filled 2017-02-16: qty 50
  Filled 2017-02-16 (×6): qty 100
  Filled 2017-02-16: qty 50
  Filled 2017-02-16 (×5): qty 100
  Filled 2017-02-16: qty 50
  Filled 2017-02-16: qty 100
  Filled 2017-02-16 (×2): qty 50
  Filled 2017-02-16: qty 100

## 2017-02-16 NOTE — Progress Notes (Signed)
eLink Physician-Brief Progress Note Patient Name: Randy Kirk DOB: 04/09/1946 MRN: [id number]   Date of Service  02/16/2017  HPI/Events of Note  RVP (+) for coronavirus  eICU Interventions  Cont droplet precaution     Intervention Category Evaluation Type: Other  [name] 02/16/2017, 6:43 AM

## 2017-02-16 NOTE — Progress Notes (Signed)
PULMONARY / CRITICAL CARE MEDICINE   Name: Randy Kirk MRN: [id number] DOB: 09/03/1946    ADMISSION DATE:  02/12/2017 CONSULTATION DATE:  02/25/2017  REFERRING MD: Dr. Charm Barges   CHIEF COMPLAINT:  Dyspnea   HISTORY OF PRESENT ILLNESS:   71 year old male with PMH of CAD, COPD (on home oxygen at baseline 2-3L), HTN, and MI, presents to ED on 2/15 with progressive dyspnea. For the past 2-3 days has been experiencing cough and increased mucous production, reported that daughter has been sick recently with possibly the flu. Upon arrival to ED patient was in severe respiratory distress requiring intubation.PCCM called to consult.   Of note patient was recently admitted from 12/7-12/11 with COPD exacerbation. Treated with steroid taper and doxycycline. Noted to have swelling of BLE since 11/2016.  Per daughter in law, pt has been sick the last 2 weeks, getting worse 2-3 days ago.    SUBJECTIVE:  Resting on vent  Viral panel + coronavirus  ven doppler neg    VITAL SIGNS: BP 99/61   Pulse 81   Temp (!) 96.8 F (36 C) (Axillary)   Resp (!) 26   Ht 5\' 8"  (1.727 m)   Wt 147 lb 11.3 oz (67 kg)   SpO2 96%   BMI 22.46 kg/m   HEMODYNAMICS:    VENTILATOR SETTINGS: Vent Mode: PRVC FiO2 (%):  [40 %] 40 % Set Rate:  [26 bmp] 26 bmp Vt Set:  [550 mL] 550 mL PEEP:  [5 cmH20] 5 cmH20 Plateau Pressure:  [18 cmH20-24 cmH20] 24 cmH20  INTAKE / OUTPUT: I/O last 3 completed shifts: In: 5229.7 [I.V.:2299.7; NG/GT:2280; IV Piggyback:650] Out: 4070 [Urine:4070]  PHYSICAL EXAMINATION: General: Adult male, no distress, lying in bed  Neuro: Sedated  HEENT:  ETT  Cardiovascular:  Tachy, no MRG, NI S1/S2, +1-2 + edmea  Lungs:  Diminished breath sounds  Abdomen:  Non-distended, active bowel sounds  Musculoskeletal:  No acute Skin:  Warm, dry, intact . Thin ecchymotic skin on forearms   LABS:  BMET  Recent Labs Lab 02/14/17 0710 02/15/17 0504 02/16/17 0225  NA 128* 128* 132*  K 4.8  5.0 4.5  CL 94* 94* 99*  CO2 22 23 26   BUN 16 37* 37*  CREATININE 0.77 1.07 0.85  GLUCOSE 125* 103* 181*    Electrolytes  Recent Labs Lab 02/14/17 0710  02/14/17 1626 02/15/17 0504 02/15/17 1806 02/16/17 0225  CALCIUM 7.9*  --   --  8.3*  --  8.3*  MG 1.9  < > 2.1 2.4 2.6*  --   PHOS 3.9  < > 3.8 5.2* 3.7  --   < > = values in this interval not displayed.  CBC  Recent Labs Lab 02/14/17 0604 02/15/17 0504 02/16/17 0225  WBC 10.6* 16.3* 14.2*  HGB 12.6* 12.0* 11.7*  HCT 36.7* 35.3* 34.6*  PLT 225 275 245    Coag's No results for input(s): APTT, INR in the last 168 hours.  Sepsis Markers  Recent Labs Lab 02/01/2017 1740  02/23/2017 2104 02/17/2017 2329 02/14/17 0710 02/15/17 0504 02/16/17 0225  LATICACIDVEN 2.66*  --  2.0* 2.1*  --   --   --   PROCALCITON  --   < >  --   --  0.62 0.79 0.46  < > = values in this interval not displayed.  ABG  Recent Labs Lab 02/04/2017 1751 02/01/2017 1935 02/15/17 0355  PHART 7.237* 7.235* 7.380  PCO2ART 62.6* 66.5* 42.7  PO2ART 107.0 630.0* 109*  Liver Enzymes  Recent Labs Lab 02/22/2017 1725  AST 41  ALT 28  ALKPHOS 74  BILITOT 0.4  ALBUMIN 4.1    Cardiac Enzymes  Recent Labs Lab February 24, 2017 2102 Feb 24, 2017 2329 02/14/17 0710  TROPONINI 0.25* 0.38* 0.48*    Glucose  Recent Labs Lab 02/14/17 2342 02/15/17 0414 02/15/17 2026 02/15/17 2358 02/16/17 0507 02/16/17 0809  GLUCAP 102* 134* 129* 160* 129* 135*    Imaging Dg Chest Port 1 View  Result Date: 02/16/2017 CLINICAL DATA:  Hypoxia EXAM: PORTABLE CHEST 1 VIEW COMPARISON:  February 15, 2017 FINDINGS: Endotracheal tube tip is 4.2 cm above the carina. Nasogastric tube tip and side port below the diaphragm. No pneumothorax. Lungs are hyperexpanded. There is a small right pleural effusion. No edema or consolidation. The heart size and pulmonary vascularity are normal. There is aortic atherosclerosis. No adenopathy. No bone lesions. IMPRESSION: Tube  positions as described without pneumothorax. Small right pleural effusion. Lungs hyperexpanded. No edema or consolidation. Stable cardiac silhouette. There is aortic atherosclerosis. Electronically Signed   By: Bretta Bang III M.D.   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  Cont abx > deescalate once with cultures.  Cont Medrol-look at wean tomorrow  Start brovana and pulmicort BID. Cont duoneb qid.    CARDIOVASCULAR A:  HTN  H/O CAD  Demand ischemia Concern for CHF given BLE swelling since 11/2016 GR 2 DD on Echo w [employer] EF   P:  I/o goal even/neg bal  Lasix 40mg  IV x 1 . Cont Metoprolol  Continue home Lipitor    RENAL A:   HypoNa, hypoCl, probaly hypovolemic Lactic Acidosis, improved >2.6>2.1  P:   IVF to [facility]   GASTROINTESTINAL A:   Nutrition  P:   NPO PPI TF    HEMATOLOGIC A:   No issues  P:  Trend CBC Hep sq DVT prophylaxis   INFECTIOUS A:   HCAP vs Viral infection >CXR w/ no definite infiltrate  PCT min elvated. LA tr down . P:   Follow cx data   Trend WBC and fever curve  Vancomycin and Cefepime > deescalate once with cultures.    ENDOCRINE A:   No issues  P:   Trend glucose on CBC     NEUROLOGIC A:   Encephalopathy secondary to sedation  Agitation  P:   Cont propofol and fentanyl drips and prn versed.  RASS goal: 0    FAMILY  - Updates: Patient was DNR prior to intubate, patient requested to be intubated. Daughter in law updated at bedside on 2/16. And 2/17   - Inter-disciplinary family meet or Palliative Care meeting due by:  2/22  Tammy Parrett  NP-C  [facility]  [phone]    02/16/2017, 9:31 AM

## 2017-02-16 NOTE — Plan of Care (Signed)
  Interdisciplinary Goals of Care Family Meeting   Date carried out:: 02/16/2017  Location of the meeting: Conference room  Member's involved: Physician, Bedside Registered Nurse and Family Member or next of kin  Durable Power of Attorney or acting medical decision maker: Daughter & Son.    Discussion: We discussed goals of care for Randy Kirk.  Lengthy discussion with the patient's children today. We discussed his current clinical state with acute on chronic hypoxic respiratory failure, ongoing delirium, COPD, and chronic diastolic congestive heart failure. We did discuss the potential that he may not be able to extubate from the ventilator given the extent of his lung disease and underlying diastolic congestive heart failure. We did discuss and one way extubation with subsequent palliation of symptoms should he develop any respiratory distress versus initiating palliation of symptoms and a terminal vent wean with extubation to prevent any discomfort or struggle. We will attempt to optimize the patient's delirium treatment as well as his respiratory and cardiac status prior to visiting the plan with extubation. They are both sure the patient would not wish to undergo tracheostomy placement or be dependent upon others long-term.  Code status: Limited Code or DNR with short term  Disposition: Continue current acute care  Time spent for the meeting: 24 minutes   Randy Kirk 02/16/2017, 10:13 PM

## 2017-02-16 NOTE — Progress Notes (Signed)
Peripherally Inserted Central Catheter/Midline Placement  The IV Nurse has discussed with the patient and/or persons authorized to consent for the patient, the purpose of this procedure and the potential benefits and risks involved with this procedure.  The benefits include less needle sticks, lab draws from the catheter, and the patient may be discharged home with the catheter. Risks include, but not limited to, infection, bleeding, blood clot (thrombus formation), and puncture of an artery; nerve damage and irregular heartbeat and possibility to perform a PICC exchange if needed/ordered by physician.  Alternatives to this procedure were also discussed.  Bard Power PICC patient education guide, fact sheet on infection prevention and patient information card has been provided to patient /or left at bedside.  Dtr gave consent for procedure over the telephone.  PICC/Midline Placement Documentation  PICC Double Lumen 02/16/17 PICC Right Basilic 38 cm 0 cm (Active)  Indication for Insertion or Continuance of Line Vasoactive infusions;Prolonged intravenous therapies 02/16/2017  5:57 PM  Exposed Catheter (cm) 0 cm 02/16/2017  5:57 PM  Site Assessment Clean;Dry;Intact 02/16/2017  5:57 PM  Lumen #1 Status Flushed;Saline locked;Blood return noted 02/16/2017  5:57 PM  Lumen #2 Status Flushed;Saline locked;Blood return noted 02/16/2017  5:57 PM  Dressing Type Transparent 02/16/2017  5:57 PM  Dressing Status Clean;Dry;Intact;Antimicrobial disc in place 02/16/2017  5:57 PM  Line Care Connections checked and tightened 02/16/2017  5:57 PM  Line Adjustment (NICU/IV Team Only) No 02/16/2017  5:57 PM  Dressing Intervention New dressing 02/16/2017  5:57 PM  Dressing Change Due 02/23/17 02/16/2017  5:57 PM       Elliot Dallyiggs, Dashiell Franchino Wright 02/16/2017, 5:57 PM

## 2017-02-16 NOTE — Progress Notes (Signed)
Randy Kirk with Infection Prevention contacted, no need for precautions for CoronaVirus. Will d/c droplet precautions at this time.

## 2017-02-16 NOTE — Progress Notes (Signed)
Pharmacy Antibiotic Note  Randy DistanceRonald L Kirk is a 71 y.o. male continues on day #4 vancomycin and cefepime for possible HCAP. Vancomycin trough is therapeutic at 18 (goal 15-20). Cefepime dose appropriate. Renal function stable.  Plan: 1) Continue vancomycin 750mg  IV q12 2) Continue cefepime 1g IV q8  Height: 5\' 8"  (172.7 cm) Weight: 147 lb 11.3 oz (67 kg) IBW/kg (Calculated) : 68.4  Temp (24hrs), Avg:97.6 F (36.4 C), Min:96.8 F (36 C), Max:98.4 F (36.9 C)   Recent Labs Lab 02/15/2017 1725 02/08/2017 1740 02/05/2017 2104 02/07/2017 2329 02/14/17 0604 02/14/17 0710 02/15/17 0504 02/16/17 0225 02/16/17 1928  WBC 16.0*  --   --   --  10.6*  --  16.3* 14.2*  --   CREATININE 0.77  --   --   --   --  0.77 1.07 0.85  --   LATICACIDVEN  --  2.66* 2.0* 2.1*  --   --   --   --   --   VANCOTROUGH  --   --   --   --   --   --   --   --  18    Estimated Creatinine Clearance: 74.4 mL/min (by C-G formula based on SCr of 0.85 mg/dL).    Allergies  Allergen Reactions  . Penicillins Swelling    Has patient had a PCN reaction causing immediate rash, facial/tongue/throat swelling, SOB or lightheadedness with hypotension: Yes Has patient had a PCN reaction causing severe rash involving mucus membranes or skin necrosis: No Has patient had a PCN reaction that required hospitalization Yes Has patient had a PCN reaction occurring within the last 10 years: Yes If all of the above answers are "NO", then may proceed with Cephalosporin use.  Tolerates Cefepime 02/16/17  . Prednisone Swelling and Rash    Antimicrobials this admission: 2/15 Vancomycin >> 2/15 Cefepime >>  Dose adjustments this admission: 2/18 VT = 18 on 750mg  q12  Microbiology results: 2/15 UCx: negative final 2/17 resp:   Thank you for allowing pharmacy to be a part of this patient's care.  Randy Kirk, Randy Kirk Randy Kirk 02/16/2017 8:24 PM

## 2017-02-16 NOTE — Progress Notes (Signed)
VASCULAR LAB PRELIMINARY  PRELIMINARY  PRELIMINARY  PRELIMINARY  Bilateral lower extremity venous duplex completed.    Preliminary report:  There is no DVT or SVT noted in the bilateral lower extremities.   Sloan Galentine, RVT 02/16/2017, 8:44 AM

## 2017-02-16 NOTE — Progress Notes (Signed)
Error open , see previous note

## 2017-02-17 ENCOUNTER — Inpatient Hospital Stay (HOSPITAL_COMMUNITY): Payer: Medicare Other

## 2017-02-17 DIAGNOSIS — J9620 Acute and chronic respiratory failure, unspecified whether with hypoxia or hypercapnia: Secondary | ICD-10-CM

## 2017-02-17 LAB — CBC
HEMATOCRIT: 35.3 % — AB (ref 39.0–52.0)
HEMOGLOBIN: 11.7 g/dL — AB (ref 13.0–17.0)
MCH: 31.9 pg (ref 26.0–34.0)
MCHC: 33.1 g/dL (ref 30.0–36.0)
MCV: 96.2 fL (ref 78.0–100.0)
Platelets: 221 10*3/uL (ref 150–400)
RBC: 3.67 MIL/uL — AB (ref 4.22–5.81)
RDW: 14 % (ref 11.5–15.5)
WBC: 13.4 10*3/uL — AB (ref 4.0–10.5)

## 2017-02-17 LAB — GLUCOSE, CAPILLARY
GLUCOSE-CAPILLARY: 143 mg/dL — AB (ref 65–99)
Glucose-Capillary: 115 mg/dL — ABNORMAL HIGH (ref 65–99)
Glucose-Capillary: 115 mg/dL — ABNORMAL HIGH (ref 65–99)
Glucose-Capillary: 135 mg/dL — ABNORMAL HIGH (ref 65–99)
Glucose-Capillary: 136 mg/dL — ABNORMAL HIGH (ref 65–99)
Glucose-Capillary: 147 mg/dL — ABNORMAL HIGH (ref 65–99)

## 2017-02-17 LAB — BASIC METABOLIC PANEL
ANION GAP: 7 (ref 5–15)
BUN: 47 mg/dL — AB (ref 6–20)
CO2: 29 mmol/L (ref 22–32)
Calcium: 8.8 mg/dL — ABNORMAL LOW (ref 8.9–10.3)
Chloride: 102 mmol/L (ref 101–111)
Creatinine, Ser: 0.83 mg/dL (ref 0.61–1.24)
Glucose, Bld: 117 mg/dL — ABNORMAL HIGH (ref 65–99)
POTASSIUM: 4.4 mmol/L (ref 3.5–5.1)
SODIUM: 138 mmol/L (ref 135–145)

## 2017-02-17 MED ORDER — MIDAZOLAM HCL 2 MG/2ML IJ SOLN
2.0000 mg | Freq: Once | INTRAMUSCULAR | Status: AC
  Administered 2017-02-17: 2 mg via INTRAVENOUS

## 2017-02-17 MED ORDER — PNEUMOCOCCAL VAC POLYVALENT 25 MCG/0.5ML IJ INJ: 0.5000 mL | INJECTION | INTRAMUSCULAR | Status: DC | PRN

## 2017-02-17 MED ORDER — SODIUM CHLORIDE 0.9 % IV SOLN
0.0000 mg/h | INTRAVENOUS | Status: DC
  Administered 2017-02-17: 1 mg/h via INTRAVENOUS
  Administered 2017-02-18 – 2017-02-19 (×3): 2 mg/h via INTRAVENOUS
  Administered 2017-02-20: 1 mg/h via INTRAVENOUS
  Administered 2017-02-21 – 2017-02-24 (×4): 2 mg/h via INTRAVENOUS
  Filled 2017-02-17 (×9): qty 10

## 2017-02-17 MED ORDER — MIDAZOLAM BOLUS VIA INFUSION
1.0000 mg | INTRAVENOUS | Status: DC | PRN
  Administered 2017-02-17 – 2017-02-19 (×13): 1 mg via INTRAVENOUS
  Filled 2017-02-17: qty 1

## 2017-02-17 MED ORDER — FUROSEMIDE 10 MG/ML IJ SOLN
40.0000 mg | Freq: Once | INTRAMUSCULAR | Status: AC
  Administered 2017-02-17: 40 mg via INTRAVENOUS
  Filled 2017-02-17: qty 4

## 2017-02-17 NOTE — Care Management Note (Signed)
Case Management Note  Patient Details  Name: Randy Kirk MRN: [id number] Date of Birth: 03/29/1946  Subjective/Objective:  Pt admitted with SOB/CHF - being followed by palliative care                  Action/Plan:   PTA from home - [facility]  On going GOC discussions - pt is ventilated.  CM will continue to follow for discharge needs   Expected Discharge Date:                  Expected Discharge Plan:  Home w Home Health Services  In-House Referral:  Clinical Social Work  Discharge planning Services  CM Consult  Post Acute Care Choice:    Choice offered to:     DME Arranged:    DME Agency:     HH Arranged:    HH Agency:     Status of Service:  In process, will continue to follow  If discussed at Long Length of Stay Meetings, dates discussed:    Additional Comments:  [name], [name], RN 02/17/2017, 11:28 AM

## 2017-02-17 NOTE — Progress Notes (Signed)
PULMONARY / CRITICAL CARE MEDICINE   Name: Randy DistanceRonald L Nicoll MRN: [id number] DOB: 03/25/1946    ADMISSION DATE:  2017/09/23 CONSULTATION DATE:  2017/09/23  REFERRING MD: Dr. Charm BargesButler   CHIEF COMPLAINT:  Dyspnea  events   71 year old male with PMH of CAD, COPD (on home oxygen at baseline 2-3L), HTN, and MI, presents to ED on 2/15 with progressive dyspnea. For the past 2-3 days has been experiencing cough and increased mucous production, reported that daughter has been sick recently with possibly the flu. Upon arrival to ED patient was in severe respiratory distress requiring intubation.PCCM called to consult.   Of note patient was recently admitted from 12/7-12/11 with COPD exacerbation. Treated with steroid taper and doxycycline. Noted to have swelling of BLE since 11/2016.  Per daughter in law, pt has been sick the last 2 weeks, getting worse 2-3 days ago.   SIGNIFICANT EVENTS: 2/15 > Presents to ED with dyspnea  2/16 - echo ef 55% with gr2 dias dysfn ETT 2/15 >> 2/18 -  Resting on vent  Viral panel + coronavirus  ven doppler neg     SUBJECTIVE/OVERNIGHT/INTERVAL HX 2/19 - Agitated and dysnch with vent on wua. Currently on fentanyl gtt and precedex gtt  VITAL SIGNS: BP 115/65   Pulse 63   Temp 97.6 F (36.4 C) (Axillary)   Resp (!) 26   Ht 5\' 8"  (1.727 m)   Wt 67 kg (147 lb 11.3 oz)   SpO2 96%   BMI 22.46 kg/m   HEMODYNAMICS:    VENTILATOR SETTINGS: Vent Mode: PRVC FiO2 (%):  [40 %] 40 % Set Rate:  [26 bmp] 26 bmp Vt Set:  [550 mL] 550 mL PEEP:  [5 cmH20] 5 cmH20 Plateau Pressure:  [16 cmH20-30 cmH20] 22 cmH20  INTAKE / OUTPUT: I/O last 3 completed shifts: In: 5020.1 [I.V.:2100.1; NG/GT:2220; IV Piggyback:700] Out: 3470 [Urine:3470]  PHYSICAL EXAMINATION:    LABS: PULMONARY  Recent Labs Lab 12-25-2017 1751 12-25-2017 1935 02/15/17 0355  PHART 7.237* 7.235* 7.380  PCO2ART 62.6* 66.5* 42.7  PO2ART 107.0 630.0* 109*  HCO3 26.9 28.1* 24.9  TCO2 29 30  --    O2SAT 97.0 100.0 98.1    CBC  Recent Labs Lab 02/15/17 0504 02/16/17 0225 02/17/17 0505  HGB 12.0* 11.7* 11.7*  HCT 35.3* 34.6* 35.3*  WBC 16.3* 14.2* 13.4*  PLT 275 245 221    COAGULATION No results for input(s): INR in the last 168 hours.  CARDIAC   Recent Labs Lab 12-25-2017 2102 12-25-2017 2329 02/14/17 0710  TROPONINI 0.25* 0.38* 0.48*   No results for input(s): PROBNP in the last 168 hours.   CHEMISTRY  Recent Labs Lab 12-25-2017 1725 02/14/17 0710 02/14/17 1052 02/14/17 1626 02/15/17 0504 02/15/17 1806 02/16/17 0225 02/17/17 0505  NA 126* 128*  --   --  128*  --  132* 138  K 5.0 4.8  --   --  5.0  --  4.5 4.4  CL 87* 94*  --   --  94*  --  99* 102  CO2 25 22  --   --  23  --  26 29  GLUCOSE 94 125*  --   --  103*  --  181* 117*  BUN 13 16  --   --  37*  --  37* 47*  CREATININE 0.77 0.77  --   --  1.07  --  0.85 0.83  CALCIUM 9.1 7.9*  --   --  8.3*  --  8.3* 8.8*  MG  --  1.9 1.9 2.1 2.4 2.6*  --   --   PHOS  --  3.9 3.4 3.8 5.2* 3.7  --   --    Estimated Creatinine Clearance: 76.2 mL/min (by C-G formula based on SCr of 0.83 mg/dL).   LIVER  Recent Labs Lab 02/21/2017 1725  AST 41  ALT 28  ALKPHOS 74  BILITOT 0.4  PROT 7.6  ALBUMIN 4.1     INFECTIOUS  Recent Labs Lab 02/17/2017 1740  02/14/2017 2104 02/01/2017 2329 02/14/17 0710 02/15/17 0504 02/16/17 0225  LATICACIDVEN 2.66*  --  2.0* 2.1*  --   --   --   PROCALCITON  --   < >  --   --  0.62 0.79 0.46  < > = values in this interval not displayed.   ENDOCRINE CBG (last 3)   Recent Labs  02/17/17 0003 02/17/17 0407 02/17/17 0826  GLUCAP 115* 115* 136*         IMAGING x48h  - image(s) personally visualized  -   highlighted in bold Dg Chest Port 1 View  Result Date: 02/17/2017 CLINICAL DATA:  Hypoxia EXAM: PORTABLE CHEST 1 VIEW COMPARISON:  February 16, 2017 FINDINGS: Endotracheal tube tip is 4.5 cm above the carina. Central catheter tip is in the superior vena cava.  Nasogastric tube tip and side port are below the diaphragm. No pneumothorax. There is a small right pleural effusion. There is right base atelectasis. No edema or consolidation. Heart size and pulmonary vascularity are normal. No adenopathy. There is atherosclerotic calcification aorta. No bone lesions. IMPRESSION: Tube and catheter positions as described without pneumothorax. Small right pleural effusion with mild right base atelectasis. No edema or consolidation. Stable cardiac silhouette. There is aortic atherosclerosis. Electronically Signed   By: Bretta Bang III M.D.   On: 02/17/2017 07:22   Dg Chest Port 1 View  Result Date: 02/16/2017 CLINICAL DATA:  Hypoxia EXAM: PORTABLE CHEST 1 VIEW COMPARISON:  February 15, 2017 FINDINGS: Endotracheal tube tip is 4.2 cm above the carina. Nasogastric tube tip and side port below the diaphragm. No pneumothorax. Lungs are hyperexpanded. There is a small right pleural effusion. No edema or consolidation. The heart size and pulmonary vascularity are normal. There is aortic atherosclerosis. No adenopathy. No bone lesions. IMPRESSION: Tube positions as described without pneumothorax. Small right pleural effusion. Lungs hyperexpanded. No edema or consolidation. Stable cardiac silhouette. There is aortic atherosclerosis. Electronically Signed   By: Bretta Bang III M.D.   On: 02/16/2017 07:21      STUDIES:  ECHO 2/15 >EF 50-55%, Gr 22 DD ,  CXR 2/15 > No acute, stable heart size and contours are WNL, chronic blunting Ven Doppler 2/17 neg   CULTURES: Urine 2/15 >NEG  Sputum 2/15 >not sent  Blood 2/15 > not sent  Influenza 2/15 (-) Viral panel 2/17  + Coronavirus  2/17 Sputum >>  ANTIBIOTICS: Cefepime 2/15 > Vancomycin 2/15 >   DISCUSSION: 71 year old male with PMH of COPD and HTN  ASSESSMENT / PLAN:  PULMONARY A: Acute on chronic resp failure (baeline 3L o2 recent since dec 2017) - aecopd due to coronavirus  -2/19 - faiuire to wean due  to agitation P:   Full vent support 9time limited trial of few to several days Nebs, steropids  CARDIOVASCULAR A:  Hx of CAD and high bP  Currently  - demand ischemia with acute gr2 dias dysfn P:  Lasix x 1  RENAL A:  Nil acute P:   monitor  GASTROINTESTINAL A:   On TF P:   TF ppo  HEMATOLOGIC A:   Nila cute P:  monitor  INFECTIOUS A:   Aecopd due to coronarivus but unable to rule out HCAP  P:   Dc vanc Anti-infectives    Start     Dose/Rate Route Frequency Ordered Stop   02/14/17 0800  vancomycin (VANCOCIN) IVPB 750 mg/150 ml premix     750 mg 150 mL/hr over 60 Minutes Intravenous Every 12 hours 02-20-2017 1902     2017-02-20 2200  ceFEPIme (MAXIPIME) 1 g in dextrose 5 % 50 mL IVPB     1 g 100 mL/hr over 30 Minutes Intravenous Every 8 hours 02/20/2017 1902     02-20-2017 1915  vancomycin (VANCOCIN) IVPB 1000 mg/200 mL premix     1,000 mg 200 mL/hr over 60 Minutes Intravenous  Once Feb 20, 2017 1902 2017-02-20 2015   February 20, 2017 1845  levofloxacin (LEVAQUIN) IVPB 750 mg  Status:  Discontinued     750 mg 100 mL/hr over 90 Minutes Intravenous Every 24 hours 02-20-2017 1844 Feb 20, 2017 1848   Feb 20, 2017 1815  vancomycin (VANCOCIN) IVPB 1000 mg/200 mL premix  Status:  Discontinued     1,000 mg 200 mL/hr over 60 Minutes Intravenous  Once 2017/02/20 1814 2017-02-20 1839   02/20/17 1815  piperacillin-tazobactam (ZOSYN) IVPB 3.375 g  Status:  Discontinued     3.375 g 100 mL/hr over 30 Minutes Intravenous  Once Feb 20, 2017 1814 2017-02-20 1839       ENDOCRINE A:   At risk hyperglycemia P:   monitor  NEUROLOGIC A:   Acute agitaed enceph on wUia P:   RASS goal: 0 Fent and precedex gtt    FAMILY  - Updates: son updated at bedside 02/17/2017   - Inter-disciplinary family meet or Palliative Care meeting due by: compelted 02/16/17 by Dr Jamison Neighbor    The patient is critically ill with multiple organ systems failure and requires high complexity decision making for assessment and  support, frequent evaluation and titration of therapies, application of advanced monitoring technologies and extensive interpretation of multiple databases.   Critical Care Time devoted to patient care services described in this note is  30  Minutes. This time reflects time of care of this signee Dr Kalman Shan. This critical care time does not reflect procedure time, or teaching time or supervisory time of PA/NP/Med student/Med Resident etc but could involve care discussion time    Dr. Kalman Shan, M.D., [facility].C.P Pulmonary and Critical Care Medicine Staff Physician [facility] Pager: [phone], If no answer or between  15:00h - 7:00h: call [phone]  02/17/2017 10:30 AM

## 2017-02-18 ENCOUNTER — Inpatient Hospital Stay (HOSPITAL_COMMUNITY): Payer: Medicare Other

## 2017-02-18 DIAGNOSIS — J9600 Acute respiratory failure, unspecified whether with hypoxia or hypercapnia: Secondary | ICD-10-CM

## 2017-02-18 LAB — CULTURE, RESPIRATORY W GRAM STAIN: Special Requests: NORMAL

## 2017-02-18 LAB — GLUCOSE, CAPILLARY
GLUCOSE-CAPILLARY: 121 mg/dL — AB (ref 65–99)
GLUCOSE-CAPILLARY: 150 mg/dL — AB (ref 65–99)
GLUCOSE-CAPILLARY: 165 mg/dL — AB (ref 65–99)
GLUCOSE-CAPILLARY: 171 mg/dL — AB (ref 65–99)
Glucose-Capillary: 137 mg/dL — ABNORMAL HIGH (ref 65–99)
Glucose-Capillary: 139 mg/dL — ABNORMAL HIGH (ref 65–99)
Glucose-Capillary: 147 mg/dL — ABNORMAL HIGH (ref 65–99)

## 2017-02-18 LAB — BASIC METABOLIC PANEL
ANION GAP: 8 (ref 5–15)
BUN: 50 mg/dL — ABNORMAL HIGH (ref 6–20)
CALCIUM: 8.6 mg/dL — AB (ref 8.9–10.3)
CHLORIDE: 99 mmol/L — AB (ref 101–111)
CO2: 29 mmol/L (ref 22–32)
Creatinine, Ser: 0.67 mg/dL (ref 0.61–1.24)
GFR calc non Af Amer: >60 mL/min (ref >60)
Glucose, Bld: 121 mg/dL — ABNORMAL HIGH (ref 65–99)
Potassium: 4.6 mmol/L (ref 3.5–5.1)
Sodium: 136 mmol/L (ref 135–145)

## 2017-02-18 LAB — CBC
HCT: 32.9 % — ABNORMAL LOW (ref 39.0–52.0)
HEMOGLOBIN: 11.1 g/dL — AB (ref 13.0–17.0)
MCH: 32.2 pg (ref 26.0–34.0)
MCHC: 33.7 g/dL (ref 30.0–36.0)
MCV: 95.4 fL (ref 78.0–100.0)
Platelets: 208 10*3/uL (ref 150–400)
RBC: 3.45 MIL/uL — AB (ref 4.22–5.81)
RDW: 13.9 % (ref 11.5–15.5)
WBC: 14.3 10*3/uL — ABNORMAL HIGH (ref 4.0–10.5)

## 2017-02-18 LAB — PHOSPHORUS: Phosphorus: 2.9 mg/dL (ref 2.5–4.6)

## 2017-02-18 LAB — MAGNESIUM: Magnesium: 2.3 mg/dL (ref 1.7–2.4)

## 2017-02-18 MED ORDER — IPRATROPIUM-ALBUTEROL 0.5-2.5 (3) MG/3ML IN SOLN
3.0000 mL | RESPIRATORY_TRACT | Status: DC
  Administered 2017-02-18 – 2017-02-21 (×18): 3 mL via RESPIRATORY_TRACT
  Filled 2017-02-18 (×18): qty 3

## 2017-02-18 MED ORDER — HALOPERIDOL LACTATE 5 MG/ML IJ SOLN
5.0000 mg | Freq: Four times a day (QID) | INTRAMUSCULAR | Status: DC
  Administered 2017-02-18 (×3): 5 mg via INTRAVENOUS
  Filled 2017-02-18 (×3): qty 1

## 2017-02-18 MED ORDER — HALOPERIDOL LACTATE 5 MG/ML IJ SOLN
5.0000 mg | Freq: Four times a day (QID) | INTRAMUSCULAR | Status: DC
  Administered 2017-02-19 – 2017-02-21 (×9): 5 mg via INTRAVENOUS
  Filled 2017-02-18 (×9): qty 1

## 2017-02-18 NOTE — Progress Notes (Signed)
[facility] called to request renewing BIL soft wrist restraint order d/t pt's ongoing agitation issues that interfere with care despite multiple other interventions.

## 2017-02-18 NOTE — Progress Notes (Signed)
Unable to answer parts of admission screening d/t pt's inability to communicate at this time. Pt is sedated and ventilated. Will re-assess and complete as able based on pt's ability to communicate.

## 2017-02-18 NOTE — Progress Notes (Signed)
Patient transported to CT and back with no complications. Vitals stable.

## 2017-02-18 NOTE — Progress Notes (Signed)
PULMONARY / CRITICAL CARE MEDICINE   Name: Randy Kirk MRN: [id number] DOB: Oct 16, 1946    ADMISSION DATE:  15-Feb-2017 CONSULTATION DATE:  02/15/2017  REFERRING MD: Dr. Charm Barges   CHIEF COMPLAINT:  Dyspnea  events   71 year old male with PMH of CAD, COPD (on home oxygen at baseline 2-3L), HTN, and MI, presents to ED on 2/15 with progressive dyspnea. For the past 2-3 days has been experiencing cough and increased mucous production, reported that daughter has been sick recently with possibly the flu. Upon arrival to ED patient was in severe respiratory distress requiring intubation.PCCM called to consult.   Of note patient was recently admitted from 12/7-12/11 with COPD exacerbation. Treated with steroid taper and doxycycline. Noted to have swelling of BLE since 11/2016.  Per daughter in law, pt has been sick the last 2 weeks, getting worse 2-3 days ago.   SIGNIFICANT EVENTS: 2/15 > Presents to ED with dyspnea  2/16 - echo ef 55% with gr2 dias dysfn ETT 2/15 >> 2/18 -  Resting on vent  Viral panel + coronavirus  ven doppler neg   2/19 - Agitated and dysnch with vent on wua. Currently on fentanyl gtt and precedex gtt   SUBJECTIVE/OVERNIGHT/INTERVAL HX 2/20 - needing 3 sedation gtt to keep calm. WUA -> easy agitation. Unable to wean  VITAL SIGNS: BP 130/64   Pulse 84   Temp 97 F (36.1 C) (Axillary)   Resp 14   Ht 5\' 8"  (1.727 m)   Wt 67.9 kg (149 lb 11.1 oz)   SpO2 98%   BMI 22.76 kg/m   HEMODYNAMICS:    VENTILATOR SETTINGS: Vent Mode: PRVC FiO2 (%):  [40 %] 40 % Set Rate:  [26 bmp] 26 bmp Vt Set:  [550 mL] 550 mL PEEP:  [5 cmH20] 5 cmH20 Plateau Pressure:  [10 cmH20-20 cmH20] 20 cmH20  INTAKE / OUTPUT: I/O last 3 completed shifts: In: 67 [I.V.:2144; Other:375; NG/GT:2255; IV Piggyback:500] Out: 3740 [Urine:3740]  PHYSICAL EXAMINATION:   General Appearance:    Looks criticall ill OBESE - NO  Head:    Normocephalic, without obvious abnormality, atraumatic   Eyes:    PERRL - yes, conjunctiva/corneas - clear      Ears:    Normal external ear canals, both ears  Nose:   NG tube - no . Has OG +  Throat:  ETT TUBE - yes  Neck:   Supple,  No enlargement/tenderness/nodules;     Lungs:     Clear to auscultation bilaterally, Ventilator   Synchrony - only when sedated  Chest wall:    No deformity  Heart:    S1 and S2 normal, no murmur, CVP - na.  Pressors - no  Abdomen:     Soft, no masses, no organomegaly  Genitalia:    Not done  Rectal:   not done  Extremities:   Extremities normal, atraumatic, no cyanosis or edema     Skin:   Intact in exposed areas . Sacral area - not reported by Rn     Neurologic:   Sedation - fent gtt, versed gtt, precedex gtt -> RASS - -3 . Moves all 4s - yes during wake up and gets agitated. CAM-ICU - +ve for delirum . Orientation - not during wua      LABS: PULMONARY  Recent Labs Lab 15-Feb-2017 1751 2017/02/15 1935 02/15/17 0355  PHART 7.237* 7.235* 7.380  PCO2ART 62.6* 66.5* 42.7  PO2ART 107.0 630.0* 109*  HCO3 26.9 28.1* 24.9  TCO2 29 30  --   O2SAT 97.0 100.0 98.1    CBC  Recent Labs Lab 02/16/17 0225 02/17/17 0505 02/18/17 0350  HGB 11.7* 11.7* 11.1*  HCT 34.6* 35.3* 32.9*  WBC 14.2* 13.4* 14.3*  PLT 245 221 208    COAGULATION No results for input(s): INR in the last 168 hours.  CARDIAC    Recent Labs Lab Feb 17, 2017 2102 February 17, 2017 2329 02/14/17 0710  TROPONINI 0.25* 0.38* 0.48*   No results for input(s): PROBNP in the last 168 hours.   CHEMISTRY  Recent Labs Lab 02/14/17 0710 02/14/17 1052 02/14/17 1626 02/15/17 0504 02/15/17 1806 02/16/17 0225 02/17/17 0505 02/18/17 0350  NA 128*  --   --  128*  --  132* 138 136  K 4.8  --   --  5.0  --  4.5 4.4 4.6  CL 94*  --   --  94*  --  99* 102 99*  CO2 22  --   --  23  --  26 29 29   GLUCOSE 125*  --   --  103*  --  181* 117* 121*  BUN 16  --   --  37*  --  37* 47* 50*  CREATININE 0.77  --   --  1.07  --  0.85 0.83 0.67  CALCIUM  7.9*  --   --  8.3*  --  8.3* 8.8* 8.6*  MG 1.9 1.9 2.1 2.4 2.6*  --   --  2.3  PHOS 3.9 3.4 3.8 5.2* 3.7  --   --  2.9   Estimated Creatinine Clearance: 80.2 mL/min (by C-G formula based on SCr of 0.67 mg/dL).   LIVER  Recent Labs Lab 17-Feb-2017 1725  AST 41  ALT 28  ALKPHOS 74  BILITOT 0.4  PROT 7.6  ALBUMIN 4.1     INFECTIOUS  Recent Labs Lab Feb 17, 2017 1740  2017-02-17 2104 Feb 17, 2017 2329 02/14/17 0710 02/15/17 0504 02/16/17 0225  LATICACIDVEN 2.66*  --  2.0* 2.1*  --   --   --   PROCALCITON  --   < >  --   --  0.62 0.79 0.46  < > = values in this interval not displayed.   ENDOCRINE CBG (last 3)   Recent Labs  02/18/17 0022 02/18/17 0407 02/18/17 0757  GLUCAP 137* 121* 150*         IMAGING x48h  - image(s) personally visualized  -   highlighted in bold Dg Chest Port 1 View  Result Date: 02/18/2017 CLINICAL DATA:  Endotracheal tube is present. EXAM: PORTABLE CHEST 1 VIEW COMPARISON:  02/17/2015 FINDINGS: The carina is difficult to visualize on this examination but endotracheal tube is at least 1.5 cm above the carina. PICC line tip in the upper SVC region. Coarse lung markings, particularly in the upper lungs are similar to the prior examinations. There appears to underlying hyperinflation. Chronic blunting at the right costophrenic angle may represent a small effusion. Again noted are few patchy densities at the right lung base. Negative for pneumothorax. Heart size within normal limits. Atherosclerotic calcifications at the aortic arch. Nasogastric tube extends into the abdomen. IMPRESSION: Stable chest radiograph findings. Persistent right basilar densities that may represent a combination of pleural fluid with atelectasis/mild airspace disease. Hyperinflation with chronic changes. Support apparatuses as described. Electronically Signed   By: Richarda Overlie M.D.   On: 02/18/2017 08:00   Dg Chest Port 1 View  Result Date: 02/17/2017 CLINICAL DATA:  Hypoxia EXAM:  PORTABLE CHEST  1 VIEW COMPARISON:  February 16, 2017 FINDINGS: Endotracheal tube tip is 4.5 cm above the carina. Central catheter tip is in the superior vena cava. Nasogastric tube tip and side port are below the diaphragm. No pneumothorax. There is a small right pleural effusion. There is right base atelectasis. No edema or consolidation. Heart size and pulmonary vascularity are normal. No adenopathy. There is atherosclerotic calcification aorta. No bone lesions. IMPRESSION: Tube and catheter positions as described without pneumothorax. Small right pleural effusion with mild right base atelectasis. No edema or consolidation. Stable cardiac silhouette. There is aortic atherosclerosis. Electronically Signed   By: Bretta Bang III M.D.   On: 02/17/2017 07:22      STUDIES:  ECHO 2/15 >EF 50-55%, Gr 22 DD ,  CXR 2/15 > No acute, stable heart size and contours are WNL, chronic blunting Ven Doppler 2/17 neg   CULTURES: Urine 2/15 >NEG  Sputum 2/15 >not sent  Blood 2/15 > not sent  Influenza 2/15 (-) Viral panel 2/17  + Coronavirus  2/17 Sputum >>  ANTIBIOTICS: Cefepime 2/15 > Vancomycin 2/15 >   DISCUSSION: 71 year old male with PMH of COPD and HTN   ASSESSMENT / PLAN:  PULMONARY A: #baseline: copd on home  #current: AECOPD - acute on chronic resp faiure due to coronavirus   02/18/2017 -> Unable to wean due to agitatino   P:   fuill vent support IV steroids BD  CARDIOVASCULAR A:  #baseline: hx of cad #current - nil acute  02/18/2017 -> Maintains bp/hr  P:  mainatain MAP > 65  RENAL  Intake/Output Summary (Last 24 hours) at 02/18/17 0914 Last data filed at 02/18/17 0700  Gross per 24 hour  Intake           2975.8 ml  Output             2935 ml  Net             40.8 ml    Recent Labs Lab 02/14/17 0710 02/15/17 0504 02/16/17 0225 02/17/17 0505 02/18/17 0350  CREATININE 0.77 1.07 0.85 0.83 0.67     A:   #baseline: nil acute  #current nil  acute  02/18/2017 -> nil acute  P:   monitor  GASTROINTESTINAL A:   O G tube with feeds  P:   pppi Tube feeds  HEMATOLOGIC  Recent Labs Lab 02/16/17 0225 02/17/17 0505 02/18/17 0350  HGB 11.7* 11.7* 11.1*  HCT 34.6* 35.3* 32.9*  WBC 14.2* 13.4* 14.3*  PLT 245 221 208    A:   #RBC: anemia critical illness -s stble #Platelet nil acute #WBC stable leukoctysoso  P:  - PRBC for hgb </= 6.9gm%    - exceptions are   -  if ACS susepcted/confirmed then transfuse for hgb </= 8.0gm%,  or    -  active bleeding with hemodynamic instability, then transfuse regardless of hemoglobin value   At at all times try to transfuse 1 unit prbc as possible with exception of active hemorrhage    INFECTIOUS  Recent Labs Lab 02/08/2017 2102 02/14/17 0710 02/15/17 0504 02/16/17 0225  PROCALCITON 0.10 0.62 0.79 0.46    Results for orders placed or performed during the hospital encounter of 02/18/2017  MRSA PCR Screening     Status: None   Collection Time: 02/14/17  1:27 AM  Result Value Ref Range Status   MRSA by PCR NEGATIVE NEGATIVE Final    Comment:        The GeneXpert  MRSA Assay (FDA approved for NASAL specimens only), is one component of a comprehensive MRSA colonization surveillance program. It is not intended to diagnose MRSA infection nor to guide or monitor treatment for MRSA infections.   Urine culture     Status: None   Collection Time: 02/14/17  2:45 AM  Result Value Ref Range Status   Specimen Description URINE, CATHETERIZED  Final   Special Requests Normal  Final   Culture NO GROWTH  Final   Report Status 02/15/2017 FINAL  Final  Culture, respiratory (NON-Expectorated)     Status: None   Collection Time: 02/15/17  7:43 PM  Result Value Ref Range Status   Specimen Description TRACHEAL ASPIRATE  Final   Special Requests Normal  Final   Gram Stain   Final    ABUNDANT WBC PRESENT, PREDOMINANTLY PMN RARE GRAM POSITIVE COCCI IN PAIRS    Culture FEW CANDIDA  ALBICANS  Final   Report Status 02/18/2017 FINAL  Final  Respiratory Panel by PCR     Status: Abnormal   Collection Time: 02/15/17  7:47 PM  Result Value Ref Range Status   Adenovirus NOT DETECTED NOT DETECTED Final   Coronavirus 229E NOT DETECTED NOT DETECTED Final   Coronavirus HKU1 NOT DETECTED NOT DETECTED Final   Coronavirus NL63 NOT DETECTED NOT DETECTED Final   Coronavirus OC43 DETECTED (A) NOT DETECTED Final    Comment: CRITICAL RESULT CALLED TO, READ BACK BY AND VERIFIED WITH: TO ACURRIN(RN) BY TCLEVELAND AT 6:09AM    Metapneumovirus NOT DETECTED NOT DETECTED Final   Rhinovirus / Enterovirus NOT DETECTED NOT DETECTED Final   Influenza A NOT DETECTED NOT DETECTED Final   Influenza B NOT DETECTED NOT DETECTED Final   Parainfluenza Virus 1 NOT DETECTED NOT DETECTED Final   Parainfluenza Virus 2 NOT DETECTED NOT DETECTED Final   Parainfluenza Virus 3 NOT DETECTED NOT DETECTED Final   Parainfluenza Virus 4 NOT DETECTED NOT DETECTED Final   Respiratory Syncytial Virus NOT DETECTED NOT DETECTED Final   Bordetella pertussis NOT DETECTED NOT DETECTED Final   Chlamydophila pneumoniae NOT DETECTED NOT DETECTED Final   Mycoplasma pneumoniae NOT DETECTED NOT DETECTED Final    A:   Coronaivrus AECOPD Possible HCAP P:   Anti-infectives    Start     Dose/Rate Route Frequency Ordered Stop   02/14/17 0800  vancomycin (VANCOCIN) IVPB 750 mg/150 ml premix  Status:  Discontinued     750 mg 150 mL/hr over 60 Minutes Intravenous Every 12 hours 02/12/2017 1902 02/17/17 1048   02/14/2017 2200  ceFEPIme (MAXIPIME) 1 g in dextrose 5 % 50 mL IVPB     1 g 100 mL/hr over 30 Minutes Intravenous Every 8 hours 02/17/2017 1902     02/12/2017 1915  vancomycin (VANCOCIN) IVPB 1000 mg/200 mL premix     1,000 mg 200 mL/hr over 60 Minutes Intravenous  Once 02/15/2017 1902 02/20/2017 2015   02/16/2017 1845  levofloxacin (LEVAQUIN) IVPB 750 mg  Status:  Discontinued     750 mg 100 mL/hr over 90 Minutes Intravenous  Every 24 hours 02/26/2017 1844 02/23/2017 1848   02/18/2017 1815  vancomycin (VANCOCIN) IVPB 1000 mg/200 mL premix  Status:  Discontinued     1,000 mg 200 mL/hr over 60 Minutes Intravenous  Once 02/21/2017 1814 02/19/2017 1839   02/01/2017 1815  piperacillin-tazobactam (ZOSYN) IVPB 3.375 g  Status:  Discontinued     3.375 g 100 mL/hr over 30 Minutes Intravenous  Once 02/26/2017 1814 02/11/2017 1839  ENDOCRINE A:   hypergluycemia   P:   ICU hyperglycemia protocol  NEUROLOGIC A:   #Baseline : on desyrel at home  #Current:  SEdateion needs on vent   02/18/2017 -> RASS +3 on wua P:   RASS goal: o to -2 on sedtion gtt Add haldol scheduled with qtc monitoring   FAMILY  - Updates: 02/18/2017 --> none atbddide  - Inter-disciplinary family meet or Palliative Care meeting due by:  Marland Kitchen. Current LOS is LOS 5 days but completed 02/16/17   DISPO Keep in ICU   The patient is critically ill with multiple organ systems failure and requires high complexity decision making for assessment and support, frequent evaluation and titration of therapies, application of advanced monitoring technologies and extensive interpretation of multiple databases.   Critical Care Time devoted to patient care services described in this note is  30  Minutes. This time reflects time of care of this signee Dr Kalman ShanMurali Shannen [place]. This critical care time does not reflect procedure time, or teaching time or supervisory time of PA/NP/Med student/Med Resident etc but could involve care discussion time    Dr. Kalman ShanMurali Carrye Goller, M.D., [facility] Hospital[facility].C.C.P Pulmonary and Critical Care Medicine Staff Physician [facility] [place] Pulmonary and Critical Care Pager: [phone], If no answer or between  15:00h - 7:00h: call [phone]  02/18/2017 9:14 AM         ASSESSMENT / PLAN:  PULMONARY A: Acute on chronic resp failure (baeline 3L o2 recent since dec 2017) - aecopd due to coronavirus  -2/19 - faiuire to wean due to  agitation P:   Full vent support 9time limited trial of few to several days Nebs, steropids  CARDIOVASCULAR A:  Hx of CAD and high bP  Currently  - demand ischemia with acute gr2 dias dysfn P:  Lasix x 1  RENAL A:   Nil acute P:   monitor  GASTROINTESTINAL A:   On TF P:   TF ppo  HEMATOLOGIC A:   Nila cute P:  monitor  INFECTIOUS A:   Aecopd due to coronarivus but unable to rule out HCAP  P:   Dc vanc Anti-infectives    Start     Dose/Rate Route Frequency Ordered Stop   02/14/17 0800  vancomycin (VANCOCIN) IVPB 750 mg/150 ml premix  Status:  Discontinued     750 mg 150 mL/hr over 60 Minutes Intravenous Every 12 hours 02/14/2017 1902 02/17/17 1048   02/25/2017 2200  ceFEPIme (MAXIPIME) 1 g in dextrose 5 % 50 mL IVPB     1 g 100 mL/hr over 30 Minutes Intravenous Every 8 hours 02/15/2017 1902     02/22/2017 1915  vancomycin (VANCOCIN) IVPB 1000 mg/200 mL premix     1,000 mg 200 mL/hr over 60 Minutes Intravenous  Once 02/15/2017 1902 02/01/2017 2015   02/21/2017 1845  levofloxacin (LEVAQUIN) IVPB 750 mg  Status:  Discontinued     750 mg 100 mL/hr over 90 Minutes Intravenous Every 24 hours 02/22/2017 1844 02/03/2017 1848   02/19/2017 1815  vancomycin (VANCOCIN) IVPB 1000 mg/200 mL premix  Status:  Discontinued     1,000 mg 200 mL/hr over 60 Minutes Intravenous  Once 02/12/2017 1814 02/02/2017 1839   02/12/2017 1815  piperacillin-tazobactam (ZOSYN) IVPB 3.375 g  Status:  Discontinued     3.375 g 100 mL/hr over 30 Minutes Intravenous  Once 02/26/2017 1814 02/16/2017 1839       ENDOCRINE A:   At risk hyperglycemia P:  monitor  NEUROLOGIC A:   Acute agitaed enceph on wUia P:   RASS goal: 0 Fent and precedex gtt    FAMILY  - Updates: son updated at bedside 02/18/2017   - Inter-disciplinary family meet or Palliative Care meeting due by: compelted 02/16/17 by Dr Jamison Neighbor    The patient is critically ill with multiple organ systems failure and requires high complexity decision  making for assessment and support, frequent evaluation and titration of therapies, application of advanced monitoring technologies and extensive interpretation of multiple databases.   Critical Care Time devoted to patient care services described in this note is  30  Minutes. This time reflects time of care of this signee Dr Kalman Shan. This critical care time does not reflect procedure time, or teaching time or supervisory time of PA/NP/Med student/Med Resident etc but could involve care discussion time    Dr. Kalman Shan, M.D., [facility].C.P Pulmonary and Critical Care Medicine Staff Physician [facility] [address] Pulmonary and Critical Care Pager: [phone], If no answer or between  15:00h - 7:00h: call [phone]  02/18/2017 9:11 AM

## 2017-02-18 NOTE — Care Management Note (Signed)
Case Management Note  Patient Details  Name: SHERWIN HOLLINGSHED MRN: [id number] Date of Birth: Apr 18, 1946  Subjective/Objective:  Pt admitted with SOB/CHF - being followed by palliative care                  Action/Plan:   PTA from home - recently discharged home with supplemental oxygen supplied by [facility].  On going [place] discussions - pt is ventilated.  CM will continue to follow for discharge needs   Expected Discharge Date:                  Expected Discharge Plan:  [place]  In-House Referral:  Clinical Social Work  Discharge planning Services  CM Consult  Post Acute Care Choice:    Choice offered to:     DME Arranged:    DME Agency:     HH Arranged:    [place] Agency:     Status of Service:  In process, will continue to follow  If discussed at Long Length of Stay Meetings, dates discussed:    Additional Comments: 02/18/2017 Pt remains intubated, son at bedside.  CM attempted to explain role however was met with resistance by son.  CM was compassionate and asked if son had concerns - he stated no.  Pt is independent from home alone, on 3-4 liters supplemental home oxygen supplied by [facility]. Maryclare Labrador, RN 02/18/2017, 11:02 AM

## 2017-02-19 ENCOUNTER — Inpatient Hospital Stay (HOSPITAL_COMMUNITY): Payer: Medicare Other

## 2017-02-19 LAB — CBC
HEMATOCRIT: 33.3 % — AB (ref 39.0–52.0)
Hemoglobin: 10.9 g/dL — ABNORMAL LOW (ref 13.0–17.0)
MCH: 31.7 pg (ref 26.0–34.0)
MCHC: 32.7 g/dL (ref 30.0–36.0)
MCV: 96.8 fL (ref 78.0–100.0)
Platelets: 247 10*3/uL (ref 150–400)
RBC: 3.44 MIL/uL — ABNORMAL LOW (ref 4.22–5.81)
RDW: 14 % (ref 11.5–15.5)
WBC: 14.1 10*3/uL — AB (ref 4.0–10.5)

## 2017-02-19 LAB — PHOSPHORUS: PHOSPHORUS: 3.3 mg/dL (ref 2.5–4.6)

## 2017-02-19 LAB — BASIC METABOLIC PANEL
ANION GAP: 8 (ref 5–15)
Anion gap: 6 (ref 5–15)
BUN: 49 mg/dL — ABNORMAL HIGH (ref 6–20)
BUN: 52 mg/dL — AB (ref 6–20)
CHLORIDE: 103 mmol/L (ref 101–111)
CO2: 28 mmol/L (ref 22–32)
CO2: 29 mmol/L (ref 22–32)
CREATININE: 0.67 mg/dL (ref 0.61–1.24)
Calcium: 8.5 mg/dL — ABNORMAL LOW (ref 8.9–10.3)
Calcium: 8.1 mg/dL — ABNORMAL LOW (ref 8.9–10.3)
Chloride: 99 mmol/L — ABNORMAL LOW (ref 101–111)
Creatinine, Ser: 0.67 mg/dL (ref 0.61–1.24)
GFR calc Af Amer: >60 mL/min (ref >60)
GFR calc Af Amer: >60 mL/min (ref >60)
GFR calc non Af Amer: >60 mL/min (ref >60)
Glucose, Bld: 118 mg/dL — ABNORMAL HIGH (ref 65–99)
Glucose, Bld: 130 mg/dL — ABNORMAL HIGH (ref 65–99)
POTASSIUM: 4.7 mmol/L (ref 3.5–5.1)
Potassium: 5 mmol/L (ref 3.5–5.1)
Sodium: 135 mmol/L (ref 135–145)
Sodium: 138 mmol/L (ref 135–145)

## 2017-02-19 LAB — GLUCOSE, CAPILLARY
GLUCOSE-CAPILLARY: 103 mg/dL — AB (ref 65–99)
GLUCOSE-CAPILLARY: 129 mg/dL — AB (ref 65–99)
GLUCOSE-CAPILLARY: 151 mg/dL — AB (ref 65–99)
Glucose-Capillary: 116 mg/dL — ABNORMAL HIGH (ref 65–99)
Glucose-Capillary: 113 mg/dL — ABNORMAL HIGH (ref 65–99)
Glucose-Capillary: 181 mg/dL — ABNORMAL HIGH (ref 65–99)

## 2017-02-19 LAB — MAGNESIUM: Magnesium: 2.2 mg/dL (ref 1.7–2.4)

## 2017-02-19 MED ORDER — FUROSEMIDE 10 MG/ML IJ SOLN
40.0000 mg | Freq: Three times a day (TID) | INTRAMUSCULAR | Status: AC
  Administered 2017-02-19 – 2017-02-21 (×6): 40 mg via INTRAVENOUS
  Filled 2017-02-19 (×6): qty 4

## 2017-02-19 MED ORDER — MIDAZOLAM HCL 2 MG/2ML IJ SOLN: 2.0000 mg | INTRAMUSCULAR | Status: DC | PRN

## 2017-02-19 NOTE — Progress Notes (Signed)
Per RN, CCM placed pt on SBT x 1 hour.  Pt developed increased HR and BP and pt was placed back on full vent support.

## 2017-02-19 NOTE — Progress Notes (Signed)
PULMONARY / CRITICAL CARE MEDICINE   Name: Randy Kirk MRN: [id number] DOB: 09/13/1946    ADMISSION DATE:  03-04-17 CONSULTATION DATE: 03/04/2017  REFERRING MD:  Dr. Charm Barges   CHIEF COMPLAINT: Dyspnea   Brief: 71 year old male with PMH of CAD, COPD (on home oxygen at baseline 2-3L), HTN, and MI, presents to ED on 2/15 with progressive dyspnea. Upon arrival to ED patient was in severe respiratory distress requiring intubation.PCCM called to consult.   Of note patient was recently admitted from 12/7-12/11 with COPD exacerbation. Treated with steroid taper and doxycycline. Noted to have swelling of BLE since 11/2016.   SUBJECTIVE:  On versed, fentanyl and precedex gtt. Failed SBT this AM  VITAL SIGNS: BP 123/68   Pulse 82   Temp 97.6 F (36.4 C) (Axillary)   Resp (!) 26   Ht 5\' 8"  (1.727 m)   Wt 69 kg (152 lb 1.9 oz)   SpO2 98%   BMI 23.13 kg/m   HEMODYNAMICS:    VENTILATOR SETTINGS: Vent Mode: PRVC FiO2 (%):  [30 %-40 %] 30 % Set Rate:  [26 bmp] 26 bmp Vt Set:  [550 mL] 550 mL PEEP:  [5 cmH20] 5 cmH20 Plateau Pressure:  [20 cmH20-26 cmH20] 25 cmH20  INTAKE / OUTPUT: I/O last 3 completed shifts: In: 21 [I.V.:2253.7; NG/GT:2360.3; IV Piggyback:250] Out: 2810 [Urine:2810]  PHYSICAL EXAMINATION: General: Adult male, lying in bed  Neuro: Sedated, Does not follow commands, pupils intact, non-purposefully  moves all extremities    HEENT: ETT in place    Cardiovascular: RRR, no MRG, NI S1/S2   Lungs: Exp wheezes noted, diminished at bases, tachypnea   Abdomen: obese, non-tender, active bowel sounds  Musculoskeletal: no acute   Skin: warm, dry, intact   LABS:  BMET  Recent Labs Lab 02/17/17 0505 02/18/17 0350 02/19/17 0457  NA 138 136 135  K 4.4 4.6 4.7  CL 102 99* 99*  CO2 29 29 28   BUN 47* 50* 49*  CREATININE 0.83 0.67 0.67  GLUCOSE 117* 121* 118*    Electrolytes  Recent Labs Lab 02/15/17 1806  02/17/17 0505 02/18/17 0350 02/19/17 0457   CALCIUM  --   < > 8.8* 8.6* 8.5*  MG 2.6*  --   --  2.3 2.2  PHOS 3.7  --   --  2.9 3.3  < > = values in this interval not displayed.  CBC  Recent Labs Lab 02/17/17 0505 02/18/17 0350 02/19/17 0457  WBC 13.4* 14.3* 14.1*  HGB 11.7* 11.1* 10.9*  HCT 35.3* 32.9* 33.3*  PLT 221 208 247    Coag's No results for input(s): APTT, INR in the last 168 hours.  Sepsis Markers  Recent Labs Lab 2017-03-04 1740  03/04/2017 2104 2017/03/04 2329 02/14/17 0710 02/15/17 0504 02/16/17 0225  LATICACIDVEN 2.66*  --  2.0* 2.1*  --   --   --   PROCALCITON  --   < >  --   --  0.62 0.79 0.46  < > = values in this interval not displayed.  ABG  Recent Labs Lab Mar 04, 2017 1751 2017/03/04 1935 02/15/17 0355  PHART 7.237* 7.235* 7.380  PCO2ART 62.6* 66.5* 42.7  PO2ART 107.0 630.0* 109*    Liver Enzymes  Recent Labs Lab 2017-03-04 1725  AST 41  ALT 28  ALKPHOS 74  BILITOT 0.4  ALBUMIN 4.1    Cardiac Enzymes  Recent Labs Lab 03/04/17 2102 03-04-17 2329 02/14/17 0710  TROPONINI 0.25* 0.38* 0.48*    Glucose  Recent Labs Lab 02/18/17 1142 02/18/17 1627 02/18/17 1920 02/18/17 2328 02/19/17 0345 02/19/17 0804  GLUCAP 139* 165* 171* 147* 103* 116*    Imaging Ct Chest Wo Contrast  Result Date: 02/18/2017 CLINICAL DATA:  Respiratory distress. EXAM: CT CHEST WITHOUT CONTRAST TECHNIQUE: Multidetector CT imaging of the chest was performed following the standard protocol without IV contrast. COMPARISON:  Chest radiograph 02/18/2017 FINDINGS: Cardiovascular: Coronary artery calcification and aortic atherosclerotic calcification. Mediastinum/Nodes: No axillary or supraclavicular adenopathy. NG tube into the stomach. Lungs/Pleura: Endotracheal tube with tip at the carina. (image 41, series 3). There is centrilobular emphysema in the upper lobes. Within the RIGHT upper lobe 5 mm nodule (image 71, series 7). There is atelectasis in the RIGHT lower lobe with effusion (image 142, series 7)  Small focus of architectural distortion in the LEFT lower lobe (image 110, series 7). Upper Abdomen: Limited view of the liver, kidneys, pancreas are unremarkable. Normal adrenal glands. Musculoskeletal: No aggressive osseous lesion IMPRESSION: 1. Endotracheal tube just above the carina. 2. RIGHT lower lobe atelectasis and small effusion. 3. RIGHT upper lobe pulmonary nodule. No follow-up needed if patient is low-risk. Non-contrast chest CT can be considered in 12 months if patient is high-risk. This recommendation follows the consensus statement: Guidelines for Management of Incidental Pulmonary Nodules Detected on CT Images: From the Fleischner Society 2017; Radiology 2017; 284:228-243. Electronically Signed   By: Genevive BiStewart  Edmunds M.D.   On: 02/18/2017 11:22   Dg Chest Port 1 View  Result Date: 02/19/2017 CLINICAL DATA:  Respiratory distress. EXAM: PORTABLE CHEST 1 VIEW COMPARISON:  Chest radiograph 02/18/2017. FINDINGS: Normal cardiomediastinal silhouette. Stable PICC line, ET tube, and orogastric tube. RIGHT lower lobe atelectasis and small effusion appears stable. IMPRESSION: Stable chest. Electronically Signed   By: Elsie StainJohn T Curnes M.D.   On: 02/19/2017 07:44    SIGNIFICANT EVENTS: 2/15 > Presents to ED with dyspnea  2/16 - echo ef 55% with gr2 dias dysfn 2/18 - + coronavirus  2/19 - Agitated and dysnch with vent on wua. Currently on fentanyl gtt and precedex gtt  STUDIES:  ECHO 2/15 >EF 50-55%, Gr 22 DD ,  CXR 2/15 > No acute, stable heart size and contours are WNL, chronic blunting Ven Doppler 2/17 neg   CULTURES: Urine 2/15 >NEG  Sputum 2/15 >not sent  Blood 2/15 > not sent  Influenza 2/15 (-) Viral panel 2/17  + Coronavirus  2/17 Sputum >>  ANTIBIOTICS: Cefepime 2/15 > Vancomycin 2/15 >   DISCUSSION: 71 year old male with PMH of COPD and HTN  ASSESSMENT / PLAN:  PULMONARY A: Acute Exacerbation COPD secondary to coronavirus  Oxygen dependent at baseline  P:   Vent  Support Wean as tolerated - currently weaning, agitation as been barrier to weaning  Taper Steroids  Continue Pulmicort, PRN albuterol   CARDIOVASCULAR A:  Acute on Chronic HF  H/O CAD P:  Cardiac Monitoring  Maintain MAP >65  Lasix 40 TID   RENAL A:   Volume Overload  P:   Trend BMP Replace electrolytes as needed  Diuresis as above   GASTROINTESTINAL A:   No issues  P:   PPI TF   HEMATOLOGIC A:   Anemia  P:  Trend CBC  INFECTIOUS A:   ?HCAP AECOPD secondary to Coronavirus  P:   Trend WBC and fever curve  Continue Cefepime and Vacomycin   ENDOCRINE A:   No issues    P:   Trend Glucose   NEUROLOGIC A:   Agitation  P:   Wean fentanyl and versed gtt to achieve RASS Continue Precedex gtt RASS goal: 0   FAMILY  - Updates: Son updated at beside. Agree with one-way extubation once we get patient optimized   - Inter-disciplinary family meet or Palliative Care meeting due by: Ongoing    Jovita Kussmaul, AG-ACNP [facility]  Pgr: [phone]  PCCM Pgr: [phone]

## 2017-02-19 NOTE — Progress Notes (Signed)
eLink Physician-Brief Progress Note Patient Name: Randy DistanceRonald L Kirk DOB: 11/15/1946 MRN: [id number]   Date of Service  02/19/2017  HPI/Events of Note  Agitation - request to renew restraint order.   eICU Interventions  Will renew restraint order.     Intervention Category Minor Interventions: Agitation / anxiety - evaluation and management  Sommer,Steven Eugene 02/19/2017, 12:18 AM

## 2017-02-19 NOTE — Progress Notes (Signed)
Attempted vent SBT, pt had apnea x 2 attempts.  Pt placed back on full vent support.

## 2017-02-19 NOTE — Progress Notes (Signed)
Nutrition Follow Up  DOCUMENTATION CODES:   Not applicable  INTERVENTION:      Continue Vital AF 1.2 at goal rate of 55 ml/h (1320 ml per day) and Prostat 30 ml BID   TF regimen providing 1784 kcals, 120 gm protein, 1021m free water daily  NUTRITION DIAGNOSIS:   Inadequate oral intake related to inability to eat as evidenced by NPO status, ongoing  GOAL:   Patient will meet greater than or equal to 90% of their needs, met  MONITOR:   TF tolerance, Vent status, Labs, Weight trends, I & O's  ASSESSMENT:   71yo Male with PMH of CAD, COPD (on home oxygen at baseline 2-3L), HTN, and MI, presents to ED on 2/15 with progressive dyspnea. For the past 2-3 days has been experiencing cough and increased mucous production, reported that daughter has been sick recently with possibly the flu. Upon arrival to ED patient was in severe respiratory distress requiring intubation.  Patient is currently intubated on ventilator support MV: 14.1 L/min Temp (24hrs), Avg:98.8 F (37.1 C), Min:97.6 F (36.4 C), Max:99.3 F (37.4 C)  Pt in respiratory failure due to severe acute COPD exacerbation and HCAP. Vital AF 1.2 formula currently infusing at goal rate of 55 ml/hr via OGT. Encephalopathic and having increased agitation. Labs and medications reviewed. CBG's [id number]  Diet Order:  Diet NPO time specified  Skin:  Reviewed, no issues  Last BM:  PTA  Height:   Ht Readings from Last 1 Encounters:  02/18/17 5' 8"  (1.727 m)    Weight:   Wt Readings from Last 1 Encounters:  02/19/17 152 lb 1.9 oz (69 kg)    Ideal Body Weight:  70 kg  BMI:  Body mass index is 23.13 kg/m.  Estimated Nutritional Needs:   Kcal:  17639 Protein:  110-120 gm  Fluid:  per MD  EDUCATION NEEDS:   No education needs identified at this time  KArthur Holms RD, LDN Pager #: [phone]-Hours Pager #: [phone]

## 2017-02-19 NOTE — Progress Notes (Signed)
CRITICAL CARE  STAFF NOTE: I, Dr Lavinia SharpsM Adithya Difrancesco have personally reviewed patient's available data, including medical history, events of note, physical examination and test results as part of my evaluation. I have discussed with resident/NP and other care providers such as pharmacist, RN and RRT.  In addition,  I personally evaluated patient and elicited key findings of   S: Improved agitation after haldol yeserday . Now off fent gtt and versed gtt. On precedex alone. But failed SBT. 6L volume up  O: RASS -3 on precded gtt Some wheeze but faint and overall synchronous on ventilator   Recent Labs Lab 02/17/17 0505 02/18/17 0350 02/19/17 0457  HGB 11.7* 11.1* 10.9*  HCT 35.3* 32.9* 33.3*  WBC 13.4* 14.3* 14.1*  PLT 221 208 247    Recent Labs Lab 02/14/17 1626 02/15/17 0504 02/15/17 1806 02/16/17 0225 02/17/17 0505 02/18/17 0350 02/19/17 0457  NA  --  128*  --  132* 138 136 135  K  --  5.0  --  4.5 4.4 4.6 4.7  CL  --  94*  --  99* 102 99* 99*  CO2  --  23  --  26 29 29 28   GLUCOSE  --  103*  --  181* 117* 121* 118*  BUN  --  37*  --  37* 47* 50* 49*  CREATININE  --  1.07  --  0.85 0.83 0.67 0.67  CALCIUM  --  8.3*  --  8.3* 8.8* 8.6* 8.5*  MG 2.1 2.4 2.6*  --   --  2.3 2.2  PHOS 3.8 5.2* 3.7  --   --  2.9 3.3   Ct Chest Wo Contrast  Result Date: 02/18/2017 CLINICAL DATA:  Respiratory distress. EXAM: CT CHEST WITHOUT CONTRAST TECHNIQUE: Multidetector CT imaging of the chest was performed following the standard protocol without IV contrast. COMPARISON:  Chest radiograph 02/18/2017 FINDINGS: Cardiovascular: Coronary artery calcification and aortic atherosclerotic calcification. Mediastinum/Nodes: No axillary or supraclavicular adenopathy. NG tube into the stomach. Lungs/Pleura: Endotracheal tube with tip at the carina. (image 41, series 3). There is centrilobular emphysema in the upper lobes. Within the RIGHT upper lobe 5 mm nodule (image 71, series 7). There is atelectasis in the  RIGHT lower lobe with effusion (image 142, series 7) Small focus of architectural distortion in the LEFT lower lobe (image 110, series 7). Upper Abdomen: Limited view of the liver, kidneys, pancreas are unremarkable. Normal adrenal glands. Musculoskeletal: No aggressive osseous lesion IMPRESSION: 1. Endotracheal tube just above the carina. 2. RIGHT lower lobe atelectasis and small effusion. 3. RIGHT upper lobe pulmonary nodule. No follow-up needed if patient is low-risk. Non-contrast chest CT can be considered in 12 months if patient is high-risk. This recommendation follows the consensus statement: Guidelines for Management of Incidental Pulmonary Nodules Detected on CT Images: From the Fleischner Society 2017; Radiology 2017; 284:228-243. Electronically Signed   By: Genevive BiStewart  Edmunds M.D.   On: 02/18/2017 11:22   Dg Chest Port 1 View  Result Date: 02/19/2017 CLINICAL DATA:  Respiratory distress. EXAM: PORTABLE CHEST 1 VIEW COMPARISON:  Chest radiograph 02/18/2017. FINDINGS: Normal cardiomediastinal silhouette. Stable PICC line, ET tube, and orogastric tube. RIGHT lower lobe atelectasis and small effusion appears stable. IMPRESSION: Stable chest. Electronically Signed   By: Elsie StainJohn T Curnes M.D.   On: 02/19/2017 07:44     A: AECOPD due to coronoavirus. Acute on chronic resp failure - does not meet sbt criteriia Acute agitated encephalopathy - slolwy improving Volume overload  P: Agree with lasix,  nebs, steroids, abx and bd Full vent support Wean sedation gtt as tolerated. Optimize agitation with precedex and continue haldol with qtc monitopring  Son updated - will see how he does over next few to several days   .  Rest per NP/medical resident whose note is outlined above and that I agree with  The patient is critically ill with multiple organ systems failure and requires high complexity decision making for assessment and support, frequent evaluation and titration of therapies, application of  advanced monitoring technologies and extensive interpretation of multiple databases.   Critical Care Time devoted to patient care services described in this note is  30  Minutes. This time reflects time of care of this signee Dr Kalman Shan. This critical care time does not reflect procedure time, or teaching time or supervisory time of PA/NP/Med student/Med Resident etc but could involve care discussion time    Dr. Kalman Shan, M.D., [facility].C.P Pulmonary and Critical Care Medicine Staff Physician [facility] Pager: [phone], If no answer or between  15:00h - 7:00h: call [phone]  02/19/2017 10:37 AM

## 2017-02-20 ENCOUNTER — Inpatient Hospital Stay (HOSPITAL_COMMUNITY): Payer: Medicare Other

## 2017-02-20 DIAGNOSIS — Z66 Do not resuscitate: Secondary | ICD-10-CM

## 2017-02-20 LAB — CBC
HCT: 31.9 % — ABNORMAL LOW (ref 39.0–52.0)
HEMOGLOBIN: 10.5 g/dL — AB (ref 13.0–17.0)
MCH: 31.5 pg (ref 26.0–34.0)
MCHC: 32.9 g/dL (ref 30.0–36.0)
MCV: 95.8 fL (ref 78.0–100.0)
Platelets: 286 10*3/uL (ref 150–400)
RBC: 3.33 MIL/uL — ABNORMAL LOW (ref 4.22–5.81)
RDW: 13.5 % (ref 11.5–15.5)
WBC: 17.2 10*3/uL — ABNORMAL HIGH (ref 4.0–10.5)

## 2017-02-20 LAB — GLUCOSE, CAPILLARY
GLUCOSE-CAPILLARY: 138 mg/dL — AB (ref 65–99)
GLUCOSE-CAPILLARY: 164 mg/dL — AB (ref 65–99)
Glucose-Capillary: 103 mg/dL — ABNORMAL HIGH (ref 65–99)
Glucose-Capillary: 113 mg/dL — ABNORMAL HIGH (ref 65–99)
Glucose-Capillary: 131 mg/dL — ABNORMAL HIGH (ref 65–99)
Glucose-Capillary: 154 mg/dL — ABNORMAL HIGH (ref 65–99)

## 2017-02-20 LAB — BASIC METABOLIC PANEL
Anion gap: 9 (ref 5–15)
BUN: 57 mg/dL — AB (ref 6–20)
CHLORIDE: 99 mmol/L — AB (ref 101–111)
CO2: 31 mmol/L (ref 22–32)
CREATININE: 0.66 mg/dL (ref 0.61–1.24)
Calcium: 8.7 mg/dL — ABNORMAL LOW (ref 8.9–10.3)
GFR calc Af Amer: >60 mL/min (ref >60)
GFR calc non Af Amer: >60 mL/min (ref >60)
Glucose, Bld: 102 mg/dL — ABNORMAL HIGH (ref 65–99)
Potassium: 4.6 mmol/L (ref 3.5–5.1)
SODIUM: 139 mmol/L (ref 135–145)

## 2017-02-20 LAB — MAGNESIUM: MAGNESIUM: 2.2 mg/dL (ref 1.7–2.4)

## 2017-02-20 LAB — PHOSPHORUS: Phosphorus: 3.9 mg/dL (ref 2.5–4.6)

## 2017-02-20 NOTE — Procedures (Deleted)
Extubation Procedure Note  Pt extubated following Cardiac Rapid Wean. No complications. Pt follows all commands.  NIF -26, VC 1.2L.  No stridor.  Pt clearly speaks name/location.  Coughs/clears secretions.  Tolerating 2L [place].   Patient Details:   Name: Randy DistanceRonald L Callow DOB: 02/10/1946 MRN: [id number]   Airway Documentation:     Evaluation  O2 sats: stable throughout Complications: No apparent complications Patient did tolerate procedure well. Bilateral Breath Sounds: Clear   Yes  Elmer PickerClifton, Shalinda Burkholder D 02/20/2017, 5:00 AM

## 2017-02-20 NOTE — Plan of Care (Signed)
Patient sedation cut to half, patient woke up with feet and arms in the air. RR up to 40's and HR up to 150's. Did not follow commands. Sedation resumed at original dose.

## 2017-02-20 NOTE — Progress Notes (Signed)
Wrist restraints discontinued. Pt occasionally wakes up, but does not reach for lines or tubes, does not interfere with care. Sedation adequate for reaching RASS goal at this time.  Of note, I've had to give sedation/analgesia PRN boluses q1hr to maintain ventilator synchrony, not necessarily d/t pt agitation.  Will continue to monitor.

## 2017-02-20 NOTE — Progress Notes (Signed)
PULMONARY / CRITICAL CARE MEDICINE   Name: Randy Kirk MRN: [id number] DOB: 1946-10-29    ADMISSION DATE:  02/04/2017 CONSULTATION DATE:  02/09/2017  REFERRING MD: Dr. Charm Barges   CHIEF COMPLAINT:  Dyspnea  events   71 year old male with PMH of CAD, COPD (on home oxygen at baseline 2-3L), HTN, and MI, presents to ED on 2/15 with progressive dyspnea. For the past 2-3 days has been experiencing cough and increased mucous production, reported that daughter has been sick recently with possibly the flu. Upon arrival to ED patient was in severe respiratory distress requiring intubation.PCCM called to consult.   Of note patient was recently admitted from 12/7-12/11 with COPD exacerbation. Treated with steroid taper and doxycycline. Noted to have swelling of BLE since 11/2016.  Per daughter in law, pt has been sick the last 2 weeks, getting worse 2-3 days ago.    STUDIES:  ECHO 2/15 >EF 50-55%, Gr 22 DD ,  CXR 2/15 > No acute, stable heart size and contours are WNL, chronic blunting Ven Doppler 2/17 neg   CULTURES: Urine 2/15 >NEG  Sputum 2/15 >not sent  Blood 2/15 > not sent  Influenza 2/15 (-) Viral panel 2/17  + Coronavirus  2/17 Sputum >>  ANTIBIOTICS: Cefepime 2/15 > Vancomycin 2/15 >   SIGNIFICANT EVENTS: 2/15 > Presents to ED with dyspnea  2/16 - echo ef 55% with gr2 dias dysfn ETT 2/15 >> 2/18 -  Resting on vent  Viral panel + coronavirus  ven doppler neg  2/19 - Agitated and dysnch with vent on wua. Currently on fentanyl gtt and precedex gtt 2/20 - needing 3 sedation gtt to keep calm. WUA -> easy agitation. Unable to wean. Rx haldol 2./21 - some improvement agitation. Family ok watching another few days   SUBJECTIVE/OVERNIGHT/INTERVAL HX 02/20/17 - LOS 7 days. Very agitated on wua and needing multiple sedation gtt and schedule haldol. Son at bedsie. Overall no improvement  VITAL SIGNS: BP (!) 82/65   Pulse 80   Temp (!) 96.5 F (35.8 C) (Axillary)   Resp (!) 26    Ht 5\' 8"  (1.727 m)   Wt 68.8 kg (151 lb 10.8 oz)   SpO2 99%   BMI 23.06 kg/m   HEMODYNAMICS:    VENTILATOR SETTINGS: Vent Mode: PRVC FiO2 (%):  [30 %] 30 % Set Rate:  [26 bmp] 26 bmp Vt Set:  [550 mL] 550 mL PEEP:  [5 cmH20] 5 cmH20 Plateau Pressure:  [18 cmH20-22 cmH20] 18 cmH20  INTAKE / OUTPUT: I/O last 3 completed shifts: In: 4626.4 [I.V.:2281.4; NG/GT:2145; IV Piggyback:200] Out: 5335 [Urine:5335]  PHYSICAL EXAMINATION:    General Appearance:    Looks criticall ill OBESE - no  Head:    Normocephalic, without obvious abnormality, atraumatic  Eyes:    PERRL - yes, conjunctiva/corneas - clear      Ears:    Normal external ear canals, both ears  Nose:   NG tube - no  Throat:  ETT TUBE - yes , OG tube - yes  Neck:   Supple,  No enlargement/tenderness/nodules     Lungs:     Clear to auscultation bilaterally, Ventilator   Synchrony - yes  Chest wall:    No deformity  Heart:    S1 and S2 normal, no murmur, CVP - na.  Pressors - no  Abdomen:     Soft, no masses, no organomegaly  Genitalia:    Not done  Rectal:   not done  Extremities:  Extremities- no edema     Skin:   Intact in exposed areas . Sacral area - not reported     Neurologic:   Sedation - multiple sedatin gtt -> RASS - -4 . Moves all 4s - yes during wua. CAM-ICU - positive for delirium agitated during wua . Orientation - not       LABS: PULMONARY  Recent Labs Lab 03-02-17 1751 2017-03-02 1935 02/15/17 0355  PHART 7.237* 7.235* 7.380  PCO2ART 62.6* 66.5* 42.7  PO2ART 107.0 630.0* 109*  HCO3 26.9 28.1* 24.9  TCO2 29 30  --   O2SAT 97.0 100.0 98.1    CBC  Recent Labs Lab 02/18/17 0350 02/19/17 0457 02/20/17 0356  HGB 11.1* 10.9* 10.5*  HCT 32.9* 33.3* 31.9*  WBC 14.3* 14.1* 17.2*  PLT 208 247 286    COAGULATION No results for input(s): INR in the last 168 hours.  CARDIAC    Recent Labs Lab 2017-03-02 2102 02-Mar-2017 2329 02/14/17 0710  TROPONINI 0.25* 0.38* 0.48*   No  results for input(s): PROBNP in the last 168 hours.   CHEMISTRY  Recent Labs Lab 02/15/17 0504 02/15/17 1806  02/17/17 0505 02/18/17 0350 02/19/17 0457 02/19/17 1739 02/20/17 0356  NA 128*  --   < > 138 136 135 138 139  K 5.0  --   < > 4.4 4.6 4.7 5.0 4.6  CL 94*  --   < > 102 99* 99* 103 99*  CO2 23  --   < > 29 29 28 29 31   GLUCOSE 103*  --   < > 117* 121* 118* 130* 102*  BUN 37*  --   < > 47* 50* 49* 52* 57*  CREATININE 1.07  --   < > 0.83 0.67 0.67 0.67 0.66  CALCIUM 8.3*  --   < > 8.8* 8.6* 8.5* 8.1* 8.7*  MG 2.4 2.6*  --   --  2.3 2.2  --  2.2  PHOS 5.2* 3.7  --   --  2.9 3.3  --  3.9  < > = values in this interval not displayed. Estimated Creatinine Clearance: 80.8 mL/min (by C-G formula based on SCr of 0.66 mg/dL).   LIVER  Recent Labs Lab 02-Mar-2017 1725  AST 41  ALT 28  ALKPHOS 74  BILITOT 0.4  PROT 7.6  ALBUMIN 4.1     INFECTIOUS  Recent Labs Lab 03-02-17 1740  Mar 02, 2017 2104 2017/03/02 2329 02/14/17 0710 02/15/17 0504 02/16/17 0225  LATICACIDVEN 2.66*  --  2.0* 2.1*  --   --   --   PROCALCITON  --   < >  --   --  0.62 0.79 0.46  < > = values in this interval not displayed.   ENDOCRINE CBG (last 3)   Recent Labs  02/19/17 2345 02/20/17 0433 02/20/17 0743  GLUCAP 129* 113* 103*         IMAGING x48h  - image(s) personally visualized  -   highlighted in bold Dg Chest Port 1 View  Result Date: 02/20/2017 CLINICAL DATA:  Hypoxia EXAM: PORTABLE CHEST 1 VIEW COMPARISON:  February 19, 2017 FINDINGS: Endotracheal tube tip is 3.3 cm above the carina. Central catheter tip is in superior vena cava. Nasogastric tube tip and side port below the diaphragm. No pneumothorax. Lungs are mildly hyperexpanded. There is a small right pleural effusion with right base atelectasis. Lungs elsewhere clear. Heart size and pulmonary vascularity are normal. No adenopathy. There is atherosclerotic calcification in the aorta. No  bone lesions. IMPRESSION: Tube and  catheter positions as described without pneumothorax. Stable small right pleural effusion with right base atelectasis. No new opacity. Stable cardiac silhouette. There is aortic atherosclerosis. Electronically Signed   By: Bretta BangWilliam  Woodruff III M.D.   On: 02/20/2017 07:49   Dg Chest Port 1 View  Result Date: 02/19/2017 CLINICAL DATA:  Respiratory distress. EXAM: PORTABLE CHEST 1 VIEW COMPARISON:  Chest radiograph 02/18/2017. FINDINGS: Normal cardiomediastinal silhouette. Stable PICC line, ET tube, and orogastric tube. RIGHT lower lobe atelectasis and small effusion appears stable. IMPRESSION: Stable chest. Electronically Signed   By: Elsie StainJohn T Curnes M.D.   On: 02/19/2017 07:44     DISCUSSION: 71 year old male with PMH of COPD and HTN Patient Active Problem List   Diagnosis Date Noted  . Respiratory failure (HCC)   . Acute respiratory distress 2017/11/25  . COPD exacerbation (HCC) 12/05/2016  . Mild dehydration 12/05/2016  . History of weight loss 12/05/2016  . Hyperlipidemia   . COPD 08/30/2009  . DYSPNEA 08/30/2009  . LEUKOCYTOSIS 07/22/2009  . TOBACCO ABUSE 07/22/2009  . Essential hypertension 07/22/2009  . MYOCARDIAL INFARCTION 07/22/2009  . CAD 07/22/2009  . Chronic ischemic heart disease 07/22/2009  . HYPOTENSION 07/22/2009     ASSESSMENT / PLAN:  PULMONARY A: #baseline: copd on home  #current: AECOPD - acute on chronic resp faiure due to coronavirus   02/20/2017 -> Does not meet sbt criteiral due to agitation P:   fuill vent support For terminal wean 02/21/17 IV steroids BD  CARDIOVASCULAR A:  #baseline: hx of cad #current - nil acute  02/20/2017 -> Maintains bp/hr  P:  mainatain MAP > 65  RENAL  Intake/Output Summary (Last 24 hours) at 02/20/17 1219 Last data filed at 02/20/17 1200  Gross per 24 hour  Intake          3194.34 ml  Output             5010 ml  Net         -1815.66 ml    Recent Labs Lab 02/17/17 0505 02/18/17 0350 02/19/17 0457  02/19/17 1739 02/20/17 0356  CREATININE 0.83 0.67 0.67 0.67 0.66     A:   #baseline: nil acute  #current nil acute  02/20/2017 -> nil acute  P:   monitor  GASTROINTESTINAL A:   O G tube with feeds  P:   pppi Tube feeds  HEMATOLOGIC  Recent Labs Lab 02/18/17 0350 02/19/17 0457 02/20/17 0356  HGB 11.1* 10.9* 10.5*  HCT 32.9* 33.3* 31.9*  WBC 14.3* 14.1* 17.2*  PLT 208 247 286    A:   #RBC: anemia critical illness -s stble #Platelet nil acute #WBC stable leukoctysoso  P:  - PRBC for hgb </= 6.9gm%    - exceptions are   -  if ACS susepcted/confirmed then transfuse for hgb </= 8.0gm%,  or    -  active bleeding with hemodynamic instability, then transfuse regardless of hemoglobin value   At at all times try to transfuse 1 unit prbc as possible with exception of active hemorrhage    INFECTIOUS  Recent Labs Lab 05-11-17 2102 02/14/17 0710 02/15/17 0504 02/16/17 0225  PROCALCITON 0.10 0.62 0.79 0.46    Results for orders placed or performed during the hospital encounter of 05-11-17  MRSA PCR Screening     Status: None   Collection Time: 02/14/17  1:27 AM  Result Value Ref Range Status   MRSA by PCR NEGATIVE NEGATIVE Final  Comment:        The GeneXpert MRSA Assay (FDA approved for NASAL specimens only), is one component of a comprehensive MRSA colonization surveillance program. It is not intended to diagnose MRSA infection nor to guide or monitor treatment for MRSA infections.   Urine culture     Status: None   Collection Time: 02/14/17  2:45 AM  Result Value Ref Range Status   Specimen Description URINE, CATHETERIZED  Final   Special Requests Normal  Final   Culture NO GROWTH  Final   Report Status 02/15/2017 FINAL  Final  Culture, respiratory (NON-Expectorated)     Status: None   Collection Time: 02/15/17  7:43 PM  Result Value Ref Range Status   Specimen Description TRACHEAL ASPIRATE  Final   Special Requests Normal  Final   Gram  Stain   Final    ABUNDANT WBC PRESENT, PREDOMINANTLY PMN RARE GRAM POSITIVE COCCI IN PAIRS    Culture FEW CANDIDA ALBICANS  Final   Report Status 02/18/2017 FINAL  Final  Respiratory Panel by PCR     Status: Abnormal   Collection Time: 02/15/17  7:47 PM  Result Value Ref Range Status   Adenovirus NOT DETECTED NOT DETECTED Final   Coronavirus 229E NOT DETECTED NOT DETECTED Final   Coronavirus HKU1 NOT DETECTED NOT DETECTED Final   Coronavirus NL63 NOT DETECTED NOT DETECTED Final   Coronavirus OC43 DETECTED (A) NOT DETECTED Final    Comment: CRITICAL RESULT CALLED TO, READ BACK BY AND VERIFIED WITH: TO ACURRIN(RN) BY TCLEVELAND AT 6:09AM    Metapneumovirus NOT DETECTED NOT DETECTED Final   Rhinovirus / Enterovirus NOT DETECTED NOT DETECTED Final   Influenza A NOT DETECTED NOT DETECTED Final   Influenza B NOT DETECTED NOT DETECTED Final   Parainfluenza Virus 1 NOT DETECTED NOT DETECTED Final   Parainfluenza Virus 2 NOT DETECTED NOT DETECTED Final   Parainfluenza Virus 3 NOT DETECTED NOT DETECTED Final   Parainfluenza Virus 4 NOT DETECTED NOT DETECTED Final   Respiratory Syncytial Virus NOT DETECTED NOT DETECTED Final   Bordetella pertussis NOT DETECTED NOT DETECTED Final   Chlamydophila pneumoniae NOT DETECTED NOT DETECTED Final   Mycoplasma pneumoniae NOT DETECTED NOT DETECTED Final    A:   Coronaivrus AECOPD Possible HCAP   2/22 - doing well without fecver P:   Dc antibiotics Anti-infectives    Start     Dose/Rate Route Frequency Ordered Stop   02/14/17 0800  vancomycin (VANCOCIN) IVPB 750 mg/150 ml premix  Status:  Discontinued     750 mg 150 mL/hr over 60 Minutes Intravenous Every 12 hours 02/25/17 1902 02/17/17 1048   02/25/17 2200  ceFEPIme (MAXIPIME) 1 g in dextrose 5 % 50 mL IVPB     1 g 100 mL/hr over 30 Minutes Intravenous Every 8 hours February 25, 2017 1902     2017/02/25 1915  vancomycin (VANCOCIN) IVPB 1000 mg/200 mL premix     1,000 mg 200 mL/hr over 60 Minutes  Intravenous  Once 02-25-2017 1902 25-Feb-2017 2015   25-Feb-2017 1845  levofloxacin (LEVAQUIN) IVPB 750 mg  Status:  Discontinued     750 mg 100 mL/hr over 90 Minutes Intravenous Every 24 hours 02/25/2017 1844 25-Feb-2017 1848   February 25, 2017 1815  vancomycin (VANCOCIN) IVPB 1000 mg/200 mL premix  Status:  Discontinued     1,000 mg 200 mL/hr over 60 Minutes Intravenous  Once 25-Feb-2017 1814 02/25/17 1839   02/25/2017 1815  piperacillin-tazobactam (ZOSYN) IVPB 3.375 g  Status:  Discontinued  3.375 g 100 mL/hr over 30 Minutes Intravenous  Once 02/02/2017 1814 02/14/2017 1839       ENDOCRINE A:   hypergluycemia   P:   ICU hyperglycemia protocol  NEUROLOGIC A:   #Baseline : on desyrel at home  #Current:  SEdateion needs on vent   02/20/2017 -> RASS +3 on wua P:   RASS goal: o to -2 on sedtion gtt Add haldol scheduled with qtc monitoring   FAMILY  - Updates: 02/20/2017 --> son updated  - Inter-disciplinary family meet or Palliative Care meeting due by:  Marland Kitchen Current LOS is LOS 7 days but completed 02/16/17. Repeat done 02/20/2017 - for terminal wean 02/21/17 AM during rounds    DISPO Keep in ICU   The patient is critically ill with multiple organ systems failure and requires high complexity decision making for assessment and support, frequent evaluation and titration of therapies, application of advanced monitoring technologies and extensive interpretation of multiple databases.   Critical Care Time devoted to patient care services described in this note is  30  Minutes. This time reflects time of care of this signee Dr Kalman Shan. This critical care time does not reflect procedure time, or teaching time or supervisory time of PA/NP/Med student/Med Resident etc but could involve care discussion time    Dr. Kalman Shan, M.D., [facility].C.P Pulmonary and Critical Care Medicine Staff Physician [facility] [place] [facility] Pager: [phone], If no answer or between   15:00h - 7:00h: call [phone]  02/20/2017 12:19 PM

## 2017-02-21 DIAGNOSIS — Z66 Do not resuscitate: Secondary | ICD-10-CM

## 2017-02-21 LAB — BASIC METABOLIC PANEL
Anion gap: 8 (ref 5–15)
BUN: 64 mg/dL — ABNORMAL HIGH (ref 6–20)
CALCIUM: 8 mg/dL — AB (ref 8.9–10.3)
CO2: 29 mmol/L (ref 22–32)
CREATININE: 0.59 mg/dL — AB (ref 0.61–1.24)
Chloride: 101 mmol/L (ref 101–111)
Glucose, Bld: 119 mg/dL — ABNORMAL HIGH (ref 65–99)
Potassium: 4.1 mmol/L (ref 3.5–5.1)
SODIUM: 138 mmol/L (ref 135–145)

## 2017-02-21 LAB — MAGNESIUM: MAGNESIUM: 2.1 mg/dL (ref 1.7–2.4)

## 2017-02-21 LAB — GLUCOSE, CAPILLARY
Glucose-Capillary: 112 mg/dL — ABNORMAL HIGH (ref 65–99)
Glucose-Capillary: 116 mg/dL — ABNORMAL HIGH (ref 65–99)

## 2017-02-21 LAB — PHOSPHORUS: Phosphorus: 3.8 mg/dL (ref 2.5–4.6)

## 2017-02-21 MED ORDER — GLYCOPYRROLATE 1 MG PO TABS
1.0000 mg | ORAL_TABLET | ORAL | Status: DC | PRN
  Filled 2017-02-21: qty 1

## 2017-02-21 MED ORDER — GLYCOPYRROLATE 0.2 MG/ML IJ SOLN
0.2000 mg | INTRAMUSCULAR | Status: DC | PRN
  Administered 2017-02-21 – 2017-02-24 (×8): 0.2 mg via INTRAVENOUS
  Filled 2017-02-21 (×10): qty 1

## 2017-02-21 MED ORDER — LORAZEPAM 2 MG/ML PO CONC: 1.0000 mg | ORAL | Status: DC | PRN

## 2017-02-21 MED ORDER — LORAZEPAM 1 MG PO TABS: 1.0000 mg | ORAL_TABLET | ORAL | Status: DC | PRN

## 2017-02-21 MED ORDER — FENTANYL BOLUS VIA INFUSION
25.0000 ug | INTRAVENOUS | Status: DC | PRN
  Administered 2017-02-22: 50 ug via INTRAVENOUS
  Administered 2017-02-22: 100 ug via INTRAVENOUS
  Filled 2017-02-21: qty 100

## 2017-02-21 MED ORDER — LORAZEPAM 2 MG/ML IJ SOLN: 1.0000 mg | INTRAMUSCULAR | Status: DC | PRN

## 2017-02-21 MED ORDER — MIDAZOLAM BOLUS VIA INFUSION
1.0000 mg | INTRAVENOUS | Status: DC | PRN
  Filled 2017-02-21: qty 2

## 2017-02-21 MED ORDER — HALOPERIDOL LACTATE 5 MG/ML IJ SOLN: 5.0000 mg | Freq: Four times a day (QID) | INTRAMUSCULAR | Status: DC | PRN

## 2017-02-21 MED ORDER — SODIUM CHLORIDE 0.9 % IV SOLN
0.0000 ug/h | INTRAVENOUS | Status: DC
  Administered 2017-02-21 – 2017-02-22 (×4): 300 ug/h via INTRAVENOUS
  Administered 2017-02-23: 400 ug/h via INTRAVENOUS
  Administered 2017-02-23 – 2017-02-24 (×2): 300 ug/h via INTRAVENOUS
  Filled 2017-02-21 (×9): qty 50

## 2017-02-21 MED ORDER — GLYCOPYRROLATE 0.2 MG/ML IJ SOLN
0.2000 mg | INTRAMUSCULAR | Status: DC | PRN
  Filled 2017-02-21: qty 1

## 2017-02-21 MED ORDER — FENTANYL BOLUS VIA INFUSION: 20.0000 ug | INTRAVENOUS | Status: DC | PRN

## 2017-02-21 MED ORDER — IPRATROPIUM-ALBUTEROL 0.5-2.5 (3) MG/3ML IN SOLN: 3.0000 mL | RESPIRATORY_TRACT | Status: DC | PRN

## 2017-02-21 NOTE — Procedures (Signed)
Extubation Procedure Note  Patient Details:   Name: [name] DOB: 04/24/1946 MRN: [id number]   Airway Documentation:     Evaluation  O2 sats: stable throughout Complications: No apparent complications Patient did tolerate procedure well. Bilateral Breath Sounds: Clear   No   Patient extubated to 2L nasal cannula.  Positive cuff leak noted.  No evidence of stridor.  Extubation was a one-way extubation.  Sats currently 96% and vitals are stable.  No apparent complications.  Ledell NossBrown, Srija Southard N 02/21/2017, 10:09 AM

## 2017-02-21 NOTE — Plan of Care (Signed)
Unused fentanyl drip returned to pharmacy and [facility]

## 2017-02-21 NOTE — Plan of Care (Signed)
[facility] called [phone] with impending extubation

## 2017-02-21 NOTE — Progress Notes (Signed)
PULMONARY / CRITICAL CARE MEDICINE   Name: Randy DistanceRonald L Rieser MRN: [id number] DOB: 11/15/1946    ADMISSION DATE:  12/25/2017 CONSULTATION DATE:  12/25/2017  REFERRING MD:  Dr. Charm BargesButler  CHIEF COMPLAINT:  Dyspnea   Brief: 71 year old male with PMH of CAD, COPD (on home oxygen at baseline 2-3L), HTN, and MI, presents to ED on 2/15 with progressive dyspnea. For the past 2-3 days has been experiencing cough and increased mucous production, reported that daughter has been sick recently with possibly the flu. Upon arrival to ED patient was in severe respiratory distress requiring intubation.PCCM called to consult.   Of note patient was recently admitted from 12/7-12/11 with COPD exacerbation. Treated with steroid taper and doxycycline. Noted to have swelling of BLE since 11/2016.   SUBJECTIVE:  Remains on fentanyl, precedex, and versed gtt. Not weaning plan to terminal extubate today   VITAL SIGNS: BP 90/61   Pulse 84   Temp 97.1 F (36.2 C) (Axillary)   Resp (!) 26   Ht 5\' 8"  (1.727 m)   Wt 67.9 kg (149 lb 11.1 oz)   SpO2 99%   BMI 22.76 kg/m   HEMODYNAMICS:    VENTILATOR SETTINGS: Vent Mode: PRVC FiO2 (%):  [30 %] 30 % Set Rate:  [26 bmp] 26 bmp Vt Set:  [550 mL] 550 mL PEEP:  [5 cmH20] 5 cmH20 Plateau Pressure:  [18 cmH20-20 cmH20] 19 cmH20  INTAKE / OUTPUT: I/O last 3 completed shifts: In: 4888.9 [I.V.:2598.9; Other:30; NG/GT:2160; IV Piggyback:100] Out: 5765 [Urine:5765]  PHYSICAL EXAMINATION: General: Adult male, no distress, lying in bed  Neuro:  Sedated, moves extremities, Pupils intact  HEENT:  ETT in place  Cardiovascular: Tachy, RR, No MRG, NI S1/S2 Lungs: No wheezes noted, diminished breath sounds, non-labored  Abdomen:  Active bowel sounds, non-tender  Musculoskeletal: no acute  Skin:  Warm, dry, intact   LABS:  BMET  Recent Labs Lab 02/19/17 1739 02/20/17 0356 02/21/17 0413  NA 138 139 138  K 5.0 4.6 4.1  CL 103 99* 101  CO2 29 31 29   BUN 52*  57* 64*  CREATININE 0.67 0.66 0.59*  GLUCOSE 130* 102* 119*    Electrolytes  Recent Labs Lab 02/19/17 0457 02/19/17 1739 02/20/17 0356 02/21/17 0413  CALCIUM 8.5* 8.1* 8.7* 8.0*  MG 2.2  --  2.2 2.1  PHOS 3.3  --  3.9 3.8    CBC  Recent Labs Lab 02/18/17 0350 02/19/17 0457 02/20/17 0356  WBC 14.3* 14.1* 17.2*  HGB 11.1* 10.9* 10.5*  HCT 32.9* 33.3* 31.9*  PLT 208 247 286    Coag's No results for input(s): APTT, INR in the last 168 hours.  Sepsis Markers  Recent Labs Lab 02/15/17 0504 02/16/17 0225  PROCALCITON 0.79 0.46    ABG  Recent Labs Lab 02/15/17 0355  PHART 7.380  PCO2ART 42.7  PO2ART 109*    Liver Enzymes No results for input(s): AST, ALT, ALKPHOS, BILITOT, ALBUMIN in the last 168 hours.  Cardiac Enzymes No results for input(s): TROPONINI, PROBNP in the last 168 hours.  Glucose  Recent Labs Lab 02/20/17 1219 02/20/17 1646 02/20/17 2030 02/21/17 0000 02/21/17 0315 02/21/17 0812  GLUCAP 138* 164* 131* 154* 112* 116*    Imaging No results found.  STUDIES:  ECHO 2/15 >EF 50-55%, Gr 22 DD ,  CXR 2/15 > No acute, stable heart size and contours are WNL, chronic blunting Ven Doppler 2/17 neg   CULTURES: Urine 2/15 >NEG  Sputum 2/15 >not sent  Blood 2/15 > not sent  Influenza 2/15 (-) Viral panel 2/17  + Coronavirus  2/17 Sputum >>  ANTIBIOTICS: Cefepime 2/15 > Vancomycin 2/15 >   SIGNIFICANT EVENTS: 2/15 > Presents to ED with dyspnea  2/16 - echo ef 55% with gr2 dias dysfn ETT 2/15 > 2/18 -Viral panel + coronavirus  2/19 - Agitated and dysnch with vent on wua. Currently on fentanyl gtt and precedex gtt 2/20 - needing 3 sedation gtt to keep calm. WUA -> easy agitation. Unable to wean. Rx haldol 2./21 - some improvement agitation. Family ok watching another few days 2/23 - Terminal Extubate   ASSESSMENT / PLAN:  PULMONARY A: Acute Exacerbation COPD secondary to coronavirus  Oxygen dependent at baseline  P:    Terminal Extubation  PRN albuterol and Duonebs   CARDIOVASCULAR A:  Acute on Chronic HF H/O CAD P:  D/C Lasix   RENAL A:   No issues  P:   D/C Labs D/C Diuresis   GASTROINTESTINAL A:   Dysphagia  P:   D/C TF  D/C PPI   HEMATOLOGIC A:   Anemia  P:  D/C Labs   INFECTIOUS A:   Coronavirus  P:   D/C Labs   ENDOCRINE A:   No issues  P:   D/C Glucose Trend   NEUROLOGIC A:   Agitation  P:   Continue Versed and Fentanyl gtt  Bolus Versed and Fentanyl as needed to achieve comfort  RASS goal: 0/-1    FAMILY  - Updates: Family updated on bedside. Plan to continue with extubation with goals of comfort care.    Jovita Kussmaul, AG-ACNP [place] [facility]  Pgr: [phone]  PCCM Pgr: [phone]

## 2017-02-21 NOTE — Plan of Care (Signed)
Randy Kirk (CCM) aware of HR 160's and BP 180/98, pt SOB.family  trying to interact with him

## 2017-02-21 NOTE — Care Management Note (Signed)
Case Management Note  Patient Details  Name: THADDEUS EVITTS MRN: [id number] Date of Birth: [birth year]  Subjective/Objective:  Pt admitted with SOB/CHF - being followed by palliative care                  Action/Plan:   PTA from home - recently discharged home with supplemental oxygen supplied by [facility].  On going [place] discussions - pt is ventilated.  CM will continue to follow for discharge needs   Expected Discharge Date:                  Expected Discharge Plan:  [place]  In-House Referral:  Clinical Social Work  Discharge planning Services  CM Consult  Post Acute Care Choice:    Choice offered to:     DME Arranged:    DME Agency:     HH Arranged:    [place] Agency:     Status of Service:  In process, will continue to follow  If discussed at Long Length of Stay Meetings, dates discussed:    Additional Comments: 02/21/2017  Withdrawal of Care - family at bedside  02/18/17 Pt remains intubated, son at bedside.  CM attempted to explain role however was met with resistance by son.  CM was compassionate and asked if son had concerns - he stated no.  Pt is independent from home alone, on 3-4 liters supplemental home oxygen supplied by [facility]. Maryclare Labrador, RN 02/21/2017, 10:29 AM

## 2017-02-21 NOTE — Plan of Care (Signed)
Patient trying to get up, fentanyl drip increased per family request

## 2017-02-22 DIAGNOSIS — J9620 Acute and chronic respiratory failure, unspecified whether with hypoxia or hypercapnia: Secondary | ICD-10-CM | POA: Diagnosis present

## 2017-02-22 DIAGNOSIS — Z515 Encounter for palliative care: Secondary | ICD-10-CM

## 2017-02-22 MED ORDER — WHITE PETROLATUM GEL
Status: AC
  Administered 2017-02-22: 1
  Filled 2017-02-22: qty 1

## 2017-02-22 NOTE — Progress Notes (Signed)
Received patient from 2S, on full comfort care, family at bedside, oriented family to the unit and staff . Patient was on versed drip and fentanyl drip on tranfer, patient looks comfortable, no signs of discomfort noted. Will contniue to monitor.

## 2017-02-22 NOTE — Progress Notes (Signed)
PULMONARY / CRITICAL CARE MEDICINE   Name: Randy Kirk MRN: [id number] DOB: Apr 26, 1946    ADMISSION DATE:  02/12/2017 CONSULTATION DATE:  02/15/2017  REFERRING MD:  Dr. Charm Barges  CHIEF COMPLAINT:  Dyspnea   Brief: 71 year old male with PMH of CAD, COPD (on home oxygen at baseline 2-3L), HTN, and MI, presents to ED on 2/15 with progressive dyspnea. For the past 2-3 days has been experiencing cough and increased mucous production, reported that daughter has been sick recently with possibly the flu. Upon arrival to ED patient was in severe respiratory distress requiring intubation.PCCM called to consult.   Of note patient was recently admitted from 12/7-12/11 with COPD exacerbation. Treated with steroid taper and doxycycline. Noted to have swelling of BLE since 11/2016.   STUDIES:  ECHO 2/15 >EF 50-55%, Gr 22 DD ,  CXR 2/15 > No acute, stable heart size and contours are WNL, chronic blunting Ven Doppler 2/17 neg   CULTURES: Urine 2/15 >NEG  Sputum 2/15 >not sent  Blood 2/15 > not sent  Influenza 2/15 (-) Viral panel 2/17  + Coronavirus  2/17 Sputum >>  ANTIBIOTICS: Cefepime 2/15 > Vancomycin 2/15 >   SIGNIFICANT EVENTS: 2/15 > Presents to ED with dyspnea  2/16 - echo ef 55% with gr2 dias dysfn ETT 2/15 > 2/18 -Viral panel + coronavirus  2/19 - Agitated and dysnch with vent on wua. Currently on fentanyl gtt and precedex gtt 2/20 - needing 3 sedation gtt to keep calm. WUA -> easy agitation. Unable to wean. Rx haldol 2./21 - some improvement agitation. Family ok watching another few days 2/23 - Terminal Extubate    SUBJECTIVE/OVERNIGHT/INTERVAL HX 02/22/17 - on fent gtt and versed gttt. Needing robinul. Lot of oral secretions. Unable to wean sedation deu to air hugner and agiation. Son says current rate gives patient comfort. Patient intermittely communicating but mostly sleepy  VITAL SIGNS: BP 122/88   Pulse (!) 104   Temp 99.2 F (37.3 C) (Axillary)   Resp 17   Ht  5\' 8"  (1.727 m)   Wt 67.9 kg (149 lb 11.1 oz)   SpO2 (!) 89%   BMI 22.76 kg/m   HEMODYNAMICS:    VENTILATOR SETTINGS:    INTAKE / OUTPUT: I/O last 3 completed shifts: In: 2666.6 [I.V.:1861.6; Other:30; NG/GT:775] Out: 4070 [Urine:4070]  PHYSICAL EXAMINATION:   General Appearance:    Looks criticall ill   Head:    Normocephalic, without obvious abnormality, atraumatic  Eyes:    PERRL - yes, conjunctiva/corneas - clear      Ears:    Normal external ear canals, both ears  Nose:   NG tube - no  Throat:  ETT TUBE - no , OG tube - no  Neck:   Supple,  No enlargement/tenderness/nodules     Lungs:     Junky sounding. Appears comfortable  Chest wall:    No deformity  Heart:    S1 and S2 normal, no murmur, CVP - no.  Pressors - no  Abdomen:     Soft, no masses, no organomegaly  Genitalia:    Not done  Rectal:   not done  Extremities:   Extremities- intact     Skin:   Intact in exposed areas . Sacral area - unknown     Neurologic:   Sedation - fent gtt and versed gtt -> RASS - 3       LABS:  PULMONARY No results for input(s): PHART, PCO2ART, PO2ART, HCO3, TCO2, O2SAT in  the last 168 hours.  Invalid input(s): PCO2, PO2  CBC  Recent Labs Lab 02/18/17 0350 02/19/17 0457 02/20/17 0356  HGB 11.1* 10.9* 10.5*  HCT 32.9* 33.3* 31.9*  WBC 14.3* 14.1* 17.2*  PLT 208 247 286    COAGULATION No results for input(s): INR in the last 168 hours.  CARDIAC  No results for input(s): TROPONINI in the last 168 hours. No results for input(s): PROBNP in the last 168 hours.   CHEMISTRY  Recent Labs Lab 02/15/17 1806  02/18/17 0350 02/19/17 0457 02/19/17 1739 02/20/17 0356 02/21/17 0413  NA  --   < > 136 135 138 139 138  K  --   < > 4.6 4.7 5.0 4.6 4.1  CL  --   < > 99* 99* 103 99* 101  CO2  --   < > 29 28 29 31 29   GLUCOSE  --   < > 121* 118* 130* 102* 119*  BUN  --   < > 50* 49* 52* 57* 64*  CREATININE  --   < > 0.67 0.67 0.67 0.66 0.59*  CALCIUM  --   < > 8.6*  8.5* 8.1* 8.7* 8.0*  MG 2.6*  --  2.3 2.2  --  2.2 2.1  PHOS 3.7  --  2.9 3.3  --  3.9 3.8  < > = values in this interval not displayed. Estimated Creatinine Clearance: 80.2 mL/min (by C-G formula based on SCr of 0.59 mg/dL (L)).   LIVER No results for input(s): AST, ALT, ALKPHOS, BILITOT, PROT, ALBUMIN, INR in the last 168 hours.   INFECTIOUS  Recent Labs Lab 02/16/17 0225  PROCALCITON 0.46     ENDOCRINE CBG (last 3)   Recent Labs  02/21/17 0000 02/21/17 0315 02/21/17 0812  GLUCAP 154* 112* 116*         IMAGING x48h  - image(s) personally visualized  -   highlighted in bold No results found.    Principal Problem:   Acute on chronic respiratory failure (HCC) due to AECOPD due to Coronavirus Active Problems:   COPD exacerbation (HCC) due to coronavirus   Terminal care   DNAR (do not attempt resuscitation)   Respiratory failure (HCC)   Acute on chronic respiratory failure (HCC) due to AECOPD due to Coronavirus This was admission problem. Ffailed to improved and S/p terminal extubation 02/21/17  Plan - comfort care  Terminal care Comfortable on fent gtt and versed gtt  Could not wean this off 02/21/17 due to air hunger Anticipate this is terminal decline Goal is comfort Having rattling throat noise but appears to be secretion related   - if death rattle expected life expetancy 12-36h  Plan Continue comfort measure fent gtt Versed gtt Robinul Life expectancy - few to several days max  DNAR (do not attempt resuscitation) DNAR Comfort    Code    Code Status Orders        Start     Ordered   02/21/17 0856  Do not attempt resuscitation (DNR)  Continuous    Question Answer Comment  In the event of cardiac or respiratory ARREST Do not call a "code blue"   In the event of cardiac or respiratory ARREST Do not perform Intubation, CPR, defibrillation or ACLS   In the event of cardiac or respiratory ARREST Use medication by any route, position,  wound care, and other measures to relive pain and suffering. May use oxygen, suction and manual treatment of airway obstruction as needed for comfort.  02/21/17 0857    Code Status History    Date Active Date Inactive Code Status Order ID Comments User Context   02/21/2017  8:54 AM 02/21/2017  8:57 AM DNR [id number]  Tobey Grim, NP Inpatient   03/15/2017  6:35 PM 02/21/2017  8:54 AM Partial Code [id number]  Tobey Grim, NP ED   12/05/2016  8:54 AM 12/09/2016  4:37 PM DNR [id number]  Haydee Salter, MD ED      DISPO Move to palliative floor  FAMILY  son updated    Dr. Kalman Shan, M.D., [facility].C.P Pulmonary and Critical Care Medicine Staff Physician [facility]monary and Critical [facility] Pager: [phone], If no answer or between  15:00h - 7:00h: call [phone]  02/22/2017 10:19 AM

## 2017-02-22 NOTE — Assessment & Plan Note (Signed)
Comfortable on fent gtt and versed gtt  Could not wean this off 02/21/17 due to air hunger Anticipate this is terminal decline Goal is comfort Having rattling throat noise but appears to be secretion related   - if death rattle expected life expetancy 12-36h  Plan Continue comfort measure fent gtt Versed gtt Robinul Life expectancy - few to several days max

## 2017-02-22 NOTE — Assessment & Plan Note (Signed)
This was admission problem. Ffailed to improved and S/p terminal extubation 02/21/17  Plan - comfort care

## 2017-02-22 NOTE — Assessment & Plan Note (Signed)
DNAR Comfort

## 2017-02-22 NOTE — Progress Notes (Signed)
Patient transferred to [phone]. Receving RN at bedside. Family aware of transfer and are in 6N waiting area.

## 2017-02-23 NOTE — Progress Notes (Signed)
PULMONARY / CRITICAL CARE MEDICINE   Name: Randy Kirk MRN: [id number] DOB: 04/08/1946    ADMISSION DATE:  08-11-2017 CONSULTATION DATE:  08-11-2017  REFERRING MD:  Dr. Charm BargesButler  CHIEF COMPLAINT:  Dyspnea   Brief: 71 year old male with PMH of CAD, COPD (on home oxygen at baseline 2-3L), HTN, and MI, presents to ED on 2/15 with progressive dyspnea. For the past 2-3 days had been experiencing cough and increased mucous production, reported that daughter has been sick recently with possibly the flu. Upon arrival to ED patient was in severe respiratory distress requiring intubation.PCCM called to consult.   Of note patient was recently admitted from 12/7-12/11 with COPD exacerbation. Treated with steroid taper and doxycycline. Noted to have swelling of BLE since 11/2016.   STUDIES:  ECHO 2/15 >EF 50-55%, Gr 22 DD ,  CXR 2/15 > No acute, stable heart size and contours are WNL, chronic blunting Ven Doppler 2/17 neg   CULTURES: Urine 2/15 >NEG  Sputum 2/15 >not sent  Blood 2/15 > not sent  Influenza 2/15 (-) Viral panel 2/17  + Coronavirus  2/17 Sputum >> a few candida albicans  ANTIBIOTICS: Cefepime 2/15 > Vancomycin 2/15 >   SIGNIFICANT EVENTS: 2/15 > Presents to ED with dyspnea  2/16 - echo ef 55% with gr2 dias dysfn ETT 2/15 > 2/18 -Viral panel + coronavirus  2/19 - Agitated and dysnch with vent on wua. Currently on fentanyl gtt and precedex gtt 2/20 - needing 3 sedation gtt to keep calm. WUA -> easy agitation. Unable to wean. Rx haldol 2./21 - some improvement agitation. Family ok watching another few days 2/23 - Terminal Extubate    SUBJECTIVE/OVERNIGHT/INTERVAL HX  moved to 6N p extubation/ not responding to verbal, fm at bedside, death rattles but no increased wob   VITAL SIGNS: BP 122/88   Pulse (!) 102   Temp 98 F (36.7 C) (Oral)   Resp 19   Ht 5\' 8"  (1.727 m)   Wt 149 lb 11.1 oz (67.9 kg)   SpO2 (!) 80%   BMI 22.76 kg/m   HEMODYNAMICS:     VENTILATOR SETTINGS:    INTAKE / OUTPUT: I/O last 3 completed shifts: In: 1462.3 [I.V.:1462.3] Out: 3370 [Urine:3370]  PHYSICAL EXAMINATION:  Elderly wm with death rattles Bilateral insp/ exp rhonchi                                              Recent Labs Lab 02/18/17 0350 02/19/17 0457 02/20/17 0356  HGB 11.1* 10.9* 10.5*  HCT 32.9* 33.3* 31.9*  WBC 14.3* 14.1* 17.2*  PLT 208 247 286    COAGULATION No results for input(s): INR in the last 168 hours.  CARDIAC  No results for input(s): TROPONINI in the last 168 hours. No results for input(s): PROBNP in the last 168 hours.   CHEMISTRY  Recent Labs Lab 02/18/17 0350 02/19/17 0457 02/19/17 1739 02/20/17 0356 02/21/17 0413  NA 136 135 138 139 138  K 4.6 4.7 5.0 4.6 4.1  CL 99* 99* 103 99* 101  CO2 29 28 29 31 29   GLUCOSE 121* 118* 130* 102* 119*  BUN 50* 49* 52* 57* 64*  CREATININE 0.67 0.67 0.67 0.66 0.59*  CALCIUM 8.6* 8.5* 8.1* 8.7* 8.0*  MG 2.3 2.2  --  2.2 2.1  PHOS 2.9 3.3  --  3.9 3.8  Estimated Creatinine Clearance: 80.2 mL/min (by C-G formula based on SCr of 0.59 mg/dL (L)).   LIVER No results for input(s): AST, ALT, ALKPHOS, BILITOT, PROT, ALBUMIN, INR in the last 168 hours.   INFECTIOUS No results for input(s): LATICACIDVEN, PROCALCITON in the last 168 hours.   ENDOCRINE CBG (last 3)   Recent Labs  02/21/17 0000 02/21/17 0315 02/21/17 0812  GLUCAP 154* 112* 116*         IMAGING x48h  - image(s) personally visualized  -   highlighted in bold No results found.    Principal Problem:   Acute on chronic respiratory failure (HCC) due to AECOPD due to Coronavirus Active Problems:   COPD exacerbation (HCC) due to coronavirus   Respiratory failure (HCC)   DNAR (do not attempt resuscitation)   Terminal care   Comfort goals achieved  On fentanyl/ versed drips / fm ok with care, remains DNR    Sandrea Hughs, MD Pulmonary and Critical Care Medicine [facility] Cell [phone] After 5:30 PM or weekends, use Beeper [phone]

## 2017-02-27 NOTE — Progress Notes (Signed)
Pt observed pulseless, no breathing at 0430 verified by another nurse Angelo,family at the bedside, informed on call MD, Dr. 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

## 2017-02-27 NOTE — Progress Notes (Signed)
Pt body transfer to morgue. 

## 2017-02-27 NOTE — Progress Notes (Signed)
Pt versed and fentanyl drip wasted and witness by Recruitment consultantAngelo charge nurse.

## 2017-02-27 DEATH — deceased

## 2017-03-04 ENCOUNTER — Telehealth: Payer: Self-pay

## 2017-03-04 NOTE — Telephone Encounter (Signed)
On 03/04/17 I [facility] The death certificate is for burial. The patient is a patient of Company secretaryDoctor Wert. The death certificate will be taken to Pulmonary Unit @ [facility] tomorrow am for signature.  On 03/06/17 I received the death certificate back from Doctor Wert. I got the death certificate ready and called the funeral home to let them know that Alona BeneJoyce with [employer] will be by to pickup the d/c.

## 2017-03-30 NOTE — Discharge Summary (Signed)
Physician Discharge Summary       Patient ID: Koleen DistanceRonald L Enneking MRN: [id number] DOB/AGE: 72/08/1946 72 y.o.  Admit date: 04/17/17 Discharge date: 02/14/17   Discharge Diagnoses:   Acute on chronic respiratory failure (HCC) due to AECOPD due to Coronavirus Active Problems:   COPD exacerbation (HCC) due to coronavirus   Respiratory failure (HCC)   DNAR (do not attempt resuscitation)   Terminal care    Hosp course 71 year old male with PMH of CAD, COPD (on home oxygen at baseline 2-3L), HTN, and MI, presents to ED on 2/15 with progressive dyspnea. For the past 2-3 days has been experiencing cough and increased mucous production, reported that daughter has been sick recently with possibly the flu. Upon arrival to ED patient was in severe respiratory distress requiring intubation.PCCM called to consult.   Of note patient was recently admitted from 12/7-12/11 with COPD exacerbation. Treated with steroid taper and doxycycline. Noted to have swelling of BLE since 11/2016.   STUDIES: ECHO 2/15 >EF 50-55%, Gr 22 DD ,  CXR 2/15 > No acute, stable heart size and contours are WNL, chronic blunting Ven Doppler 2/17 neg   CULTURES: Urine 2/15 >NEG  Sputum 2/15 >not sent  Blood 2/15 >not sent  Influenza 2/15 (-) Viral panel 2/17 + Coronavirus  2/17 Sputum >>  ANTIBIOTICS: Cefepime 2/15 > Vancomycin 2/15 >  SIGNIFICANT EVENTS: 2/15 >Presents to ED with dyspnea  2/16 - echo ef 55% with gr2 dias dysfn ETT 2/15 > 2/18 -Viral panel + coronavirus  2/19 - Agitated and dysnch with vent on wua. Currently on fentanyl gtt and precedex gtt 2/20 - needing 3 sedation gtt to keep calm. WUA -> easy agitation. Unable to wean. Rx haldol 2./21 - some improvement agitation. Family ok watching another few days 2/23 - Terminal Extubate   Pt sedated p extubation on versed and fentanyl drips and moved to 6N where expired with comfort goals maintained terminally.      Sandrea HughsMichael Tkeya Stencil,  MD Pulmonary and Critical Care Medicine [facility] Cell [phone] After 5:30 PM or weekends, use Beeper [phone]

## 2018-11-06 IMAGING — CT CT CHEST W/O CM
2 of 4 series · 15 of 36 positions shown, 18 images · non-contrast
Comparison: Chest radiograph 02/18/2017

CLINICAL DATA: Respiratory distress.

EXAM:
CT CHEST WITHOUT CONTRAST
TECHNIQUE: Multidetector CT imaging of the chest was performed following the
standard protocol without IV contrast.

[Series 3: thorax 2.0 · axial · 0.66mm/px · z∈[+1201,+1483]mm · 12 of 159 slices shown, 15 images]
[im 9/159  mediastinal]
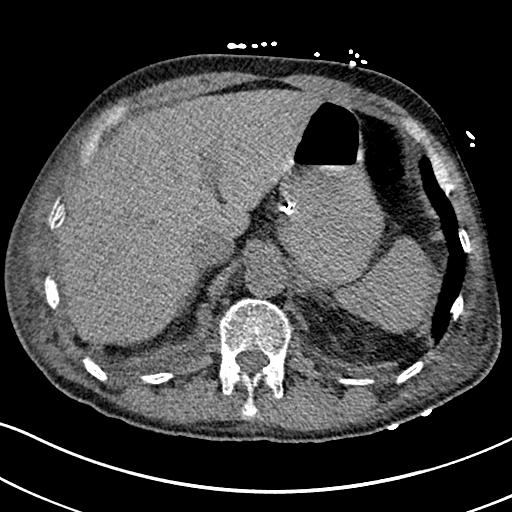
[im 9/159  lung]
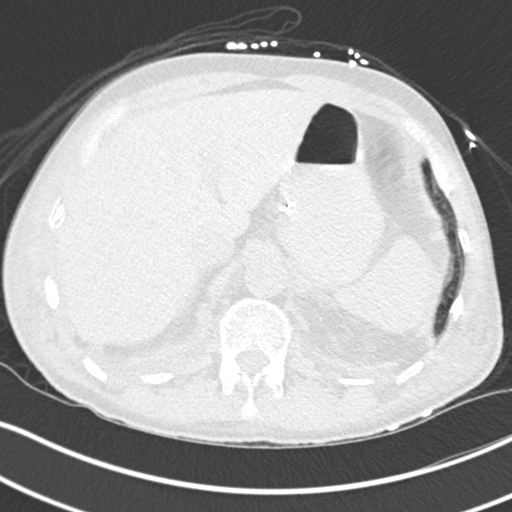
[im 25/159  lung]
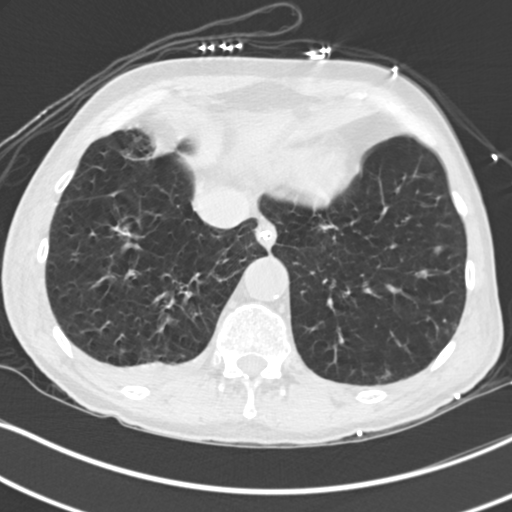
[im 34/159  lung]
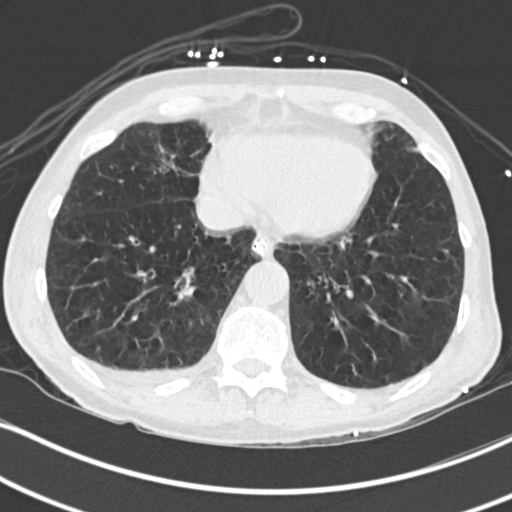
[im 50/159  lung]
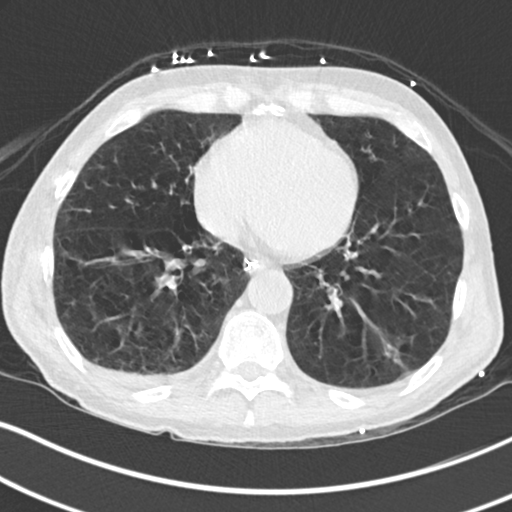
[im 59/159  mediastinal]
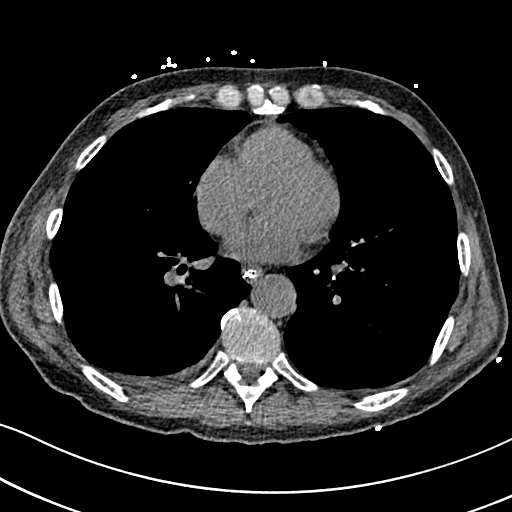
[im 59/159  lung]
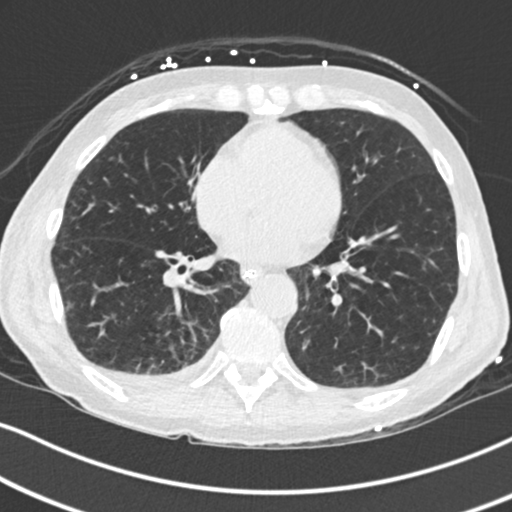
[im 75/159  lung]
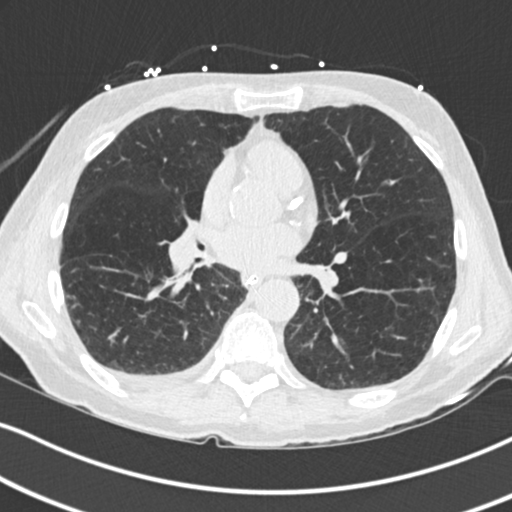
[im 84/159  lung]
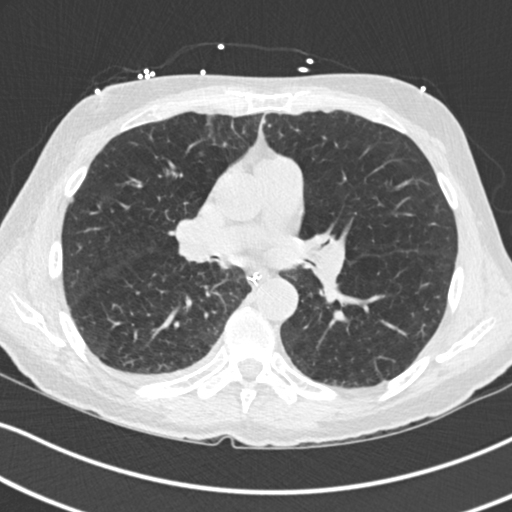
[im 100/159  lung]
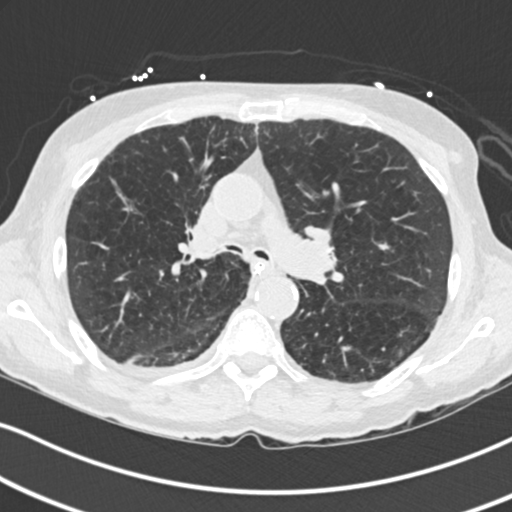
[im 109/159  mediastinal]
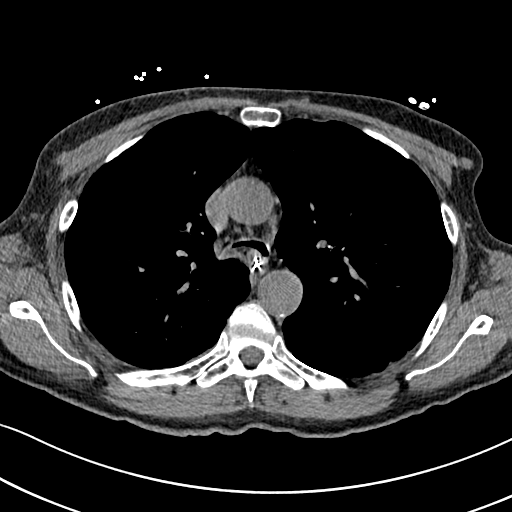
[im 109/159  lung]
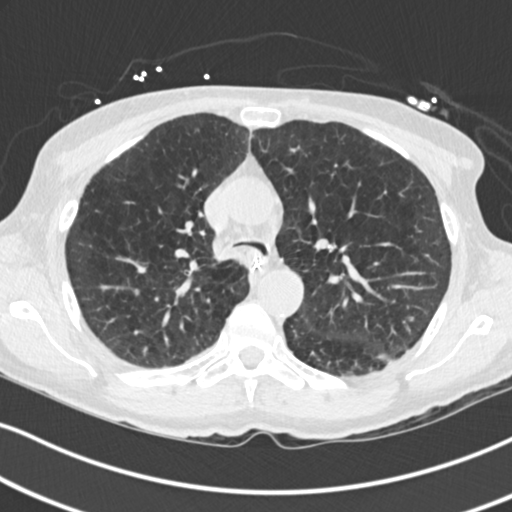
[im 125/159  lung]
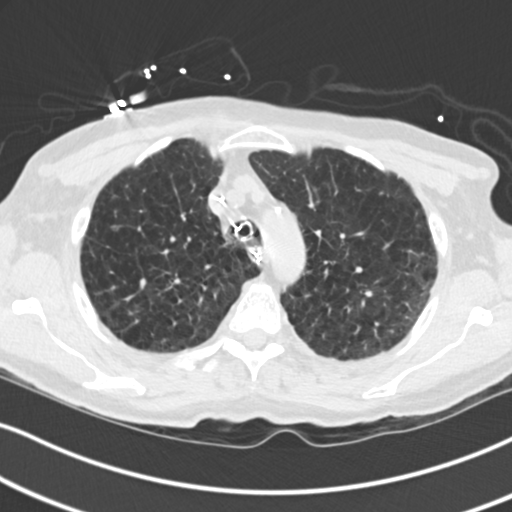
[im 134/159  lung]
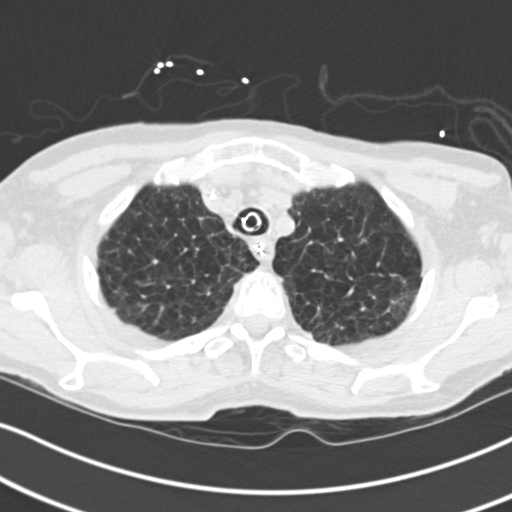
[im 150/159  lung]
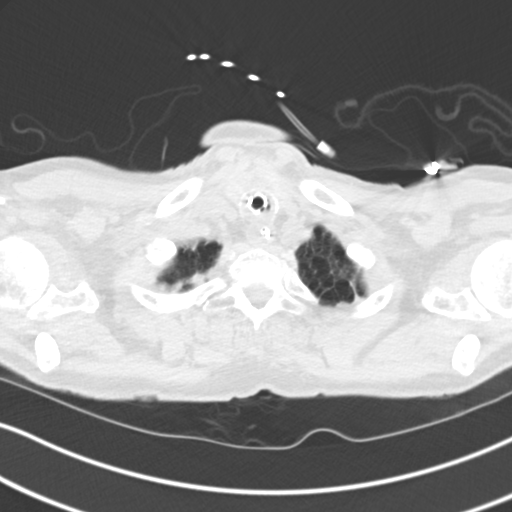

[Series 5: coronal · coronal · 0.67mm/px · 3 of 101 slices shown]
[im 21/101  lung]
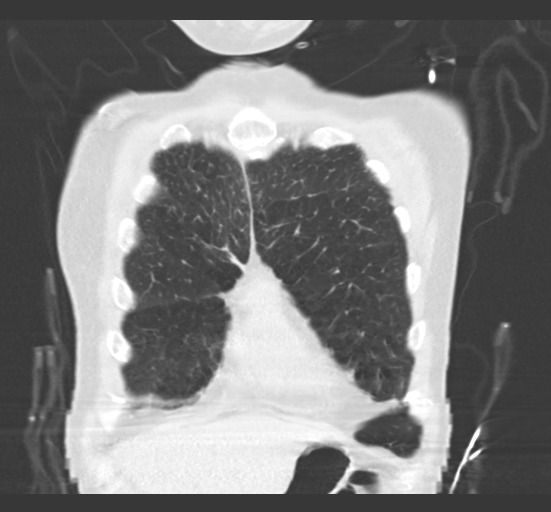
[im 41/101  lung]
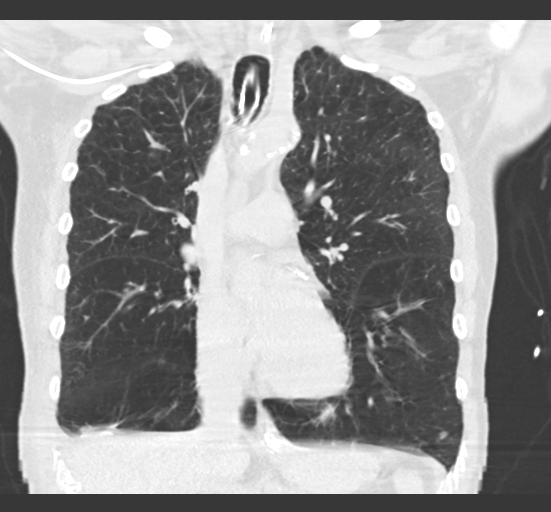
[im 61/101  lung]
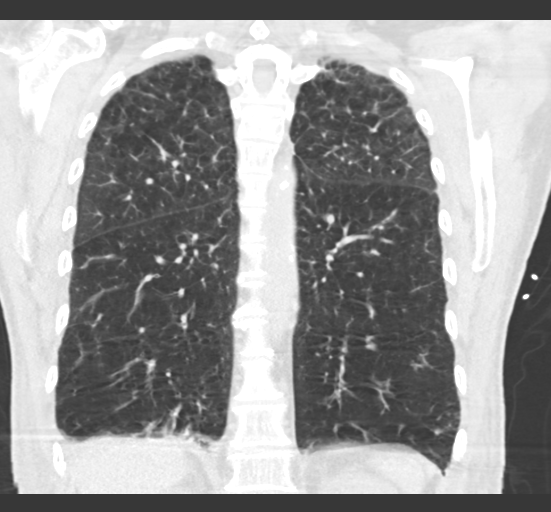

[15 of 36 positions shown; findings below may reference images not displayed]

FINDINGS: Cardiovascular: Coronary artery calcification and aortic
atherosclerotic calcification.

Mediastinum/Nodes: No axillary or supraclavicular adenopathy. NG
tube into the stomach.

Lungs/Pleura: Endotracheal tube with tip at the carina. (image 41,
series 3). There is centrilobular emphysema in the upper lobes.
Within the RIGHT upper lobe 5 mm nodule (image 71, series 7). There
is atelectasis in the RIGHT lower lobe with effusion (image 142,
series 7)

Small focus of architectural distortion in the LEFT lower lobe
(image 110, series 7).

Upper Abdomen: Limited view of the liver, kidneys, pancreas are
unremarkable. Normal adrenal glands.

Musculoskeletal: No aggressive osseous lesion
IMPRESSION: 1. Endotracheal tube just above the carina.
2. RIGHT lower lobe atelectasis and small effusion.
3. RIGHT upper lobe pulmonary nodule. No follow-up needed if patient
is low-risk. Non-contrast chest CT can be considered in 12 months if
patient is high-risk. This recommendation follows the consensus
statement: Guidelines for Management of Incidental Pulmonary Nodules
Detected on CT Images: From the [HOSPITAL] 7746; Radiology

## 2018-11-07 IMAGING — CR DG CHEST 1V PORT
1 series · 1 of 1 positions shown · non-contrast
Comparison: Chest radiograph 02/18/2017.

CLINICAL DATA: Respiratory distress.

EXAM:
PORTABLE CHEST 1 VIEW

[AP]
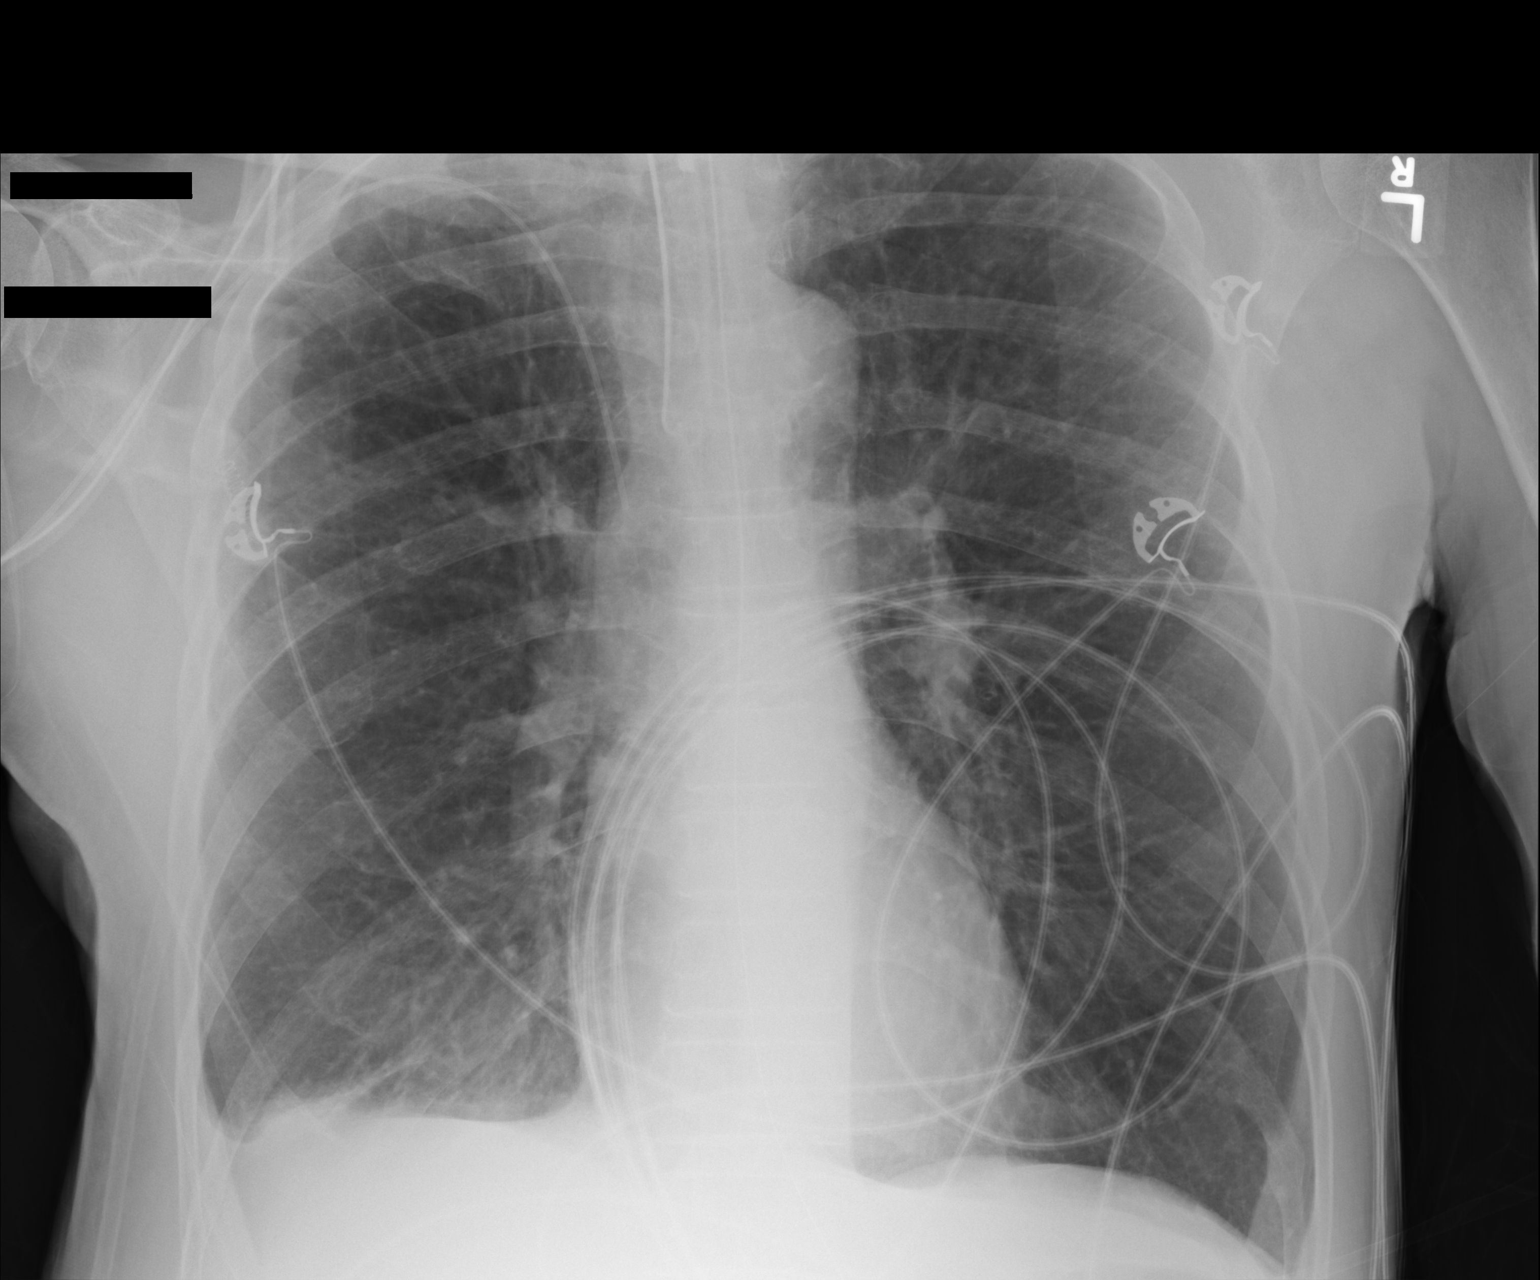

[1 of 1 positions shown; findings below may reference images not displayed]

FINDINGS: Normal cardiomediastinal silhouette. Stable PICC line, ET tube, and
orogastric tube. RIGHT lower lobe atelectasis and small effusion
appears stable.
IMPRESSION: Stable chest.
